# Patient Record
Sex: Female | Born: 1935
Health system: Southern US, Community
[De-identification: ages and names within clinical notes are randomized; demographics above are authoritative.]

## PROBLEM LIST (undated history)

## (undated) DIAGNOSIS — E7800 Pure hypercholesterolemia, unspecified: Secondary | ICD-10-CM

## (undated) DIAGNOSIS — G8929 Other chronic pain: Secondary | ICD-10-CM

## (undated) DIAGNOSIS — Z87442 Personal history of urinary calculi: Secondary | ICD-10-CM

## (undated) DIAGNOSIS — Z9861 Coronary angioplasty status: Secondary | ICD-10-CM

## (undated) DIAGNOSIS — R519 Headache, unspecified: Secondary | ICD-10-CM

## (undated) DIAGNOSIS — E039 Hypothyroidism, unspecified: Secondary | ICD-10-CM

## (undated) DIAGNOSIS — I447 Left bundle-branch block, unspecified: Secondary | ICD-10-CM

## (undated) DIAGNOSIS — I255 Ischemic cardiomyopathy: Secondary | ICD-10-CM

## (undated) DIAGNOSIS — N3281 Overactive bladder: Secondary | ICD-10-CM

## (undated) DIAGNOSIS — K219 Gastro-esophageal reflux disease without esophagitis: Secondary | ICD-10-CM

## (undated) DIAGNOSIS — M5416 Radiculopathy, lumbar region: Secondary | ICD-10-CM

## (undated) DIAGNOSIS — Z8489 Family history of other specified conditions: Secondary | ICD-10-CM

## (undated) DIAGNOSIS — M5135 Other intervertebral disc degeneration, thoracolumbar region: Secondary | ICD-10-CM

## (undated) DIAGNOSIS — F341 Dysthymic disorder: Secondary | ICD-10-CM

## (undated) DIAGNOSIS — R7301 Impaired fasting glucose: Secondary | ICD-10-CM

## (undated) DIAGNOSIS — I5022 Chronic systolic (congestive) heart failure: Secondary | ICD-10-CM

## (undated) DIAGNOSIS — T4145XA Adverse effect of unspecified anesthetic, initial encounter: Secondary | ICD-10-CM

## (undated) DIAGNOSIS — T8859XA Other complications of anesthesia, initial encounter: Secondary | ICD-10-CM

## (undated) DIAGNOSIS — I2510 Atherosclerotic heart disease of native coronary artery without angina pectoris: Secondary | ICD-10-CM

## (undated) DIAGNOSIS — R51 Headache: Secondary | ICD-10-CM

## (undated) DIAGNOSIS — I10 Essential (primary) hypertension: Secondary | ICD-10-CM

## (undated) HISTORY — PX: TONSILLECTOMY: SUR1361

## (undated) HISTORY — DX: Impaired fasting glucose: R73.01

## (undated) HISTORY — DX: Other intervertebral disc degeneration, thoracolumbar region: M51.35

## (undated) HISTORY — DX: Gastro-esophageal reflux disease without esophagitis: K21.9

## (undated) HISTORY — DX: Hypothyroidism, unspecified: E03.9

## (undated) HISTORY — PX: CORNEAL TRANSPLANT: SHX108

## (undated) HISTORY — DX: Left bundle-branch block, unspecified: I44.7

## (undated) HISTORY — DX: Overactive bladder: N32.81

## (undated) HISTORY — PX: CATARACT EXTRACTION W/ INTRAOCULAR LENS  IMPLANT, BILATERAL: SHX1307

## (undated) HISTORY — DX: Essential (primary) hypertension: I10

## (undated) HISTORY — DX: Dysthymic disorder: F34.1

## (undated) HISTORY — DX: Coronary angioplasty status: Z98.61

## (undated) HISTORY — DX: Atherosclerotic heart disease of native coronary artery without angina pectoris: I25.10

## (undated) HISTORY — PX: EYE SURGERY: SHX253

---

## 1978-09-02 HISTORY — PX: KIDNEY STONE SURGERY: SHX686

## 2014-10-04 DIAGNOSIS — T86848 Other complications of corneal transplant: Secondary | ICD-10-CM | POA: Diagnosis not present

## 2014-10-04 DIAGNOSIS — T85398A Other mechanical complication of other ocular prosthetic devices, implants and grafts, initial encounter: Secondary | ICD-10-CM | POA: Diagnosis not present

## 2014-11-02 DIAGNOSIS — J9801 Acute bronchospasm: Secondary | ICD-10-CM | POA: Diagnosis not present

## 2014-11-02 DIAGNOSIS — J209 Acute bronchitis, unspecified: Secondary | ICD-10-CM | POA: Diagnosis not present

## 2014-12-13 DIAGNOSIS — R7301 Impaired fasting glucose: Secondary | ICD-10-CM | POA: Diagnosis not present

## 2014-12-13 DIAGNOSIS — E782 Mixed hyperlipidemia: Secondary | ICD-10-CM | POA: Diagnosis not present

## 2014-12-13 DIAGNOSIS — E039 Hypothyroidism, unspecified: Secondary | ICD-10-CM | POA: Diagnosis not present

## 2014-12-20 DIAGNOSIS — E039 Hypothyroidism, unspecified: Secondary | ICD-10-CM | POA: Diagnosis not present

## 2014-12-20 DIAGNOSIS — N3941 Urge incontinence: Secondary | ICD-10-CM | POA: Diagnosis not present

## 2014-12-20 DIAGNOSIS — E782 Mixed hyperlipidemia: Secondary | ICD-10-CM | POA: Diagnosis not present

## 2014-12-20 DIAGNOSIS — D3910 Neoplasm of uncertain behavior of unspecified ovary: Secondary | ICD-10-CM | POA: Diagnosis not present

## 2014-12-20 DIAGNOSIS — K219 Gastro-esophageal reflux disease without esophagitis: Secondary | ICD-10-CM | POA: Diagnosis not present

## 2015-01-16 DIAGNOSIS — R110 Nausea: Secondary | ICD-10-CM | POA: Diagnosis not present

## 2015-01-16 DIAGNOSIS — A09 Infectious gastroenteritis and colitis, unspecified: Secondary | ICD-10-CM | POA: Diagnosis not present

## 2015-04-04 DIAGNOSIS — T85398A Other mechanical complication of other ocular prosthetic devices, implants and grafts, initial encounter: Secondary | ICD-10-CM | POA: Diagnosis not present

## 2015-04-04 DIAGNOSIS — T86848 Other complications of corneal transplant: Secondary | ICD-10-CM | POA: Diagnosis not present

## 2015-07-12 DIAGNOSIS — R7301 Impaired fasting glucose: Secondary | ICD-10-CM | POA: Diagnosis not present

## 2015-07-12 DIAGNOSIS — K219 Gastro-esophageal reflux disease without esophagitis: Secondary | ICD-10-CM | POA: Diagnosis not present

## 2015-07-12 DIAGNOSIS — J309 Allergic rhinitis, unspecified: Secondary | ICD-10-CM | POA: Diagnosis not present

## 2015-07-12 DIAGNOSIS — N3281 Overactive bladder: Secondary | ICD-10-CM | POA: Diagnosis not present

## 2015-07-12 DIAGNOSIS — I10 Essential (primary) hypertension: Secondary | ICD-10-CM | POA: Diagnosis not present

## 2015-07-12 DIAGNOSIS — Z23 Encounter for immunization: Secondary | ICD-10-CM | POA: Diagnosis not present

## 2015-07-12 DIAGNOSIS — F39 Unspecified mood [affective] disorder: Secondary | ICD-10-CM | POA: Diagnosis not present

## 2015-07-12 DIAGNOSIS — E785 Hyperlipidemia, unspecified: Secondary | ICD-10-CM | POA: Diagnosis not present

## 2015-07-12 DIAGNOSIS — Z6828 Body mass index (BMI) 28.0-28.9, adult: Secondary | ICD-10-CM | POA: Diagnosis not present

## 2015-10-05 DIAGNOSIS — H52213 Irregular astigmatism, bilateral: Secondary | ICD-10-CM | POA: Diagnosis not present

## 2015-10-05 DIAGNOSIS — T85398A Other mechanical complication of other ocular prosthetic devices, implants and grafts, initial encounter: Secondary | ICD-10-CM | POA: Diagnosis not present

## 2015-11-16 DIAGNOSIS — M1990 Unspecified osteoarthritis, unspecified site: Secondary | ICD-10-CM | POA: Diagnosis not present

## 2015-11-16 DIAGNOSIS — I10 Essential (primary) hypertension: Secondary | ICD-10-CM | POA: Diagnosis not present

## 2015-11-16 DIAGNOSIS — F39 Unspecified mood [affective] disorder: Secondary | ICD-10-CM | POA: Diagnosis not present

## 2015-11-16 DIAGNOSIS — E038 Other specified hypothyroidism: Secondary | ICD-10-CM | POA: Diagnosis not present

## 2015-11-16 DIAGNOSIS — R7301 Impaired fasting glucose: Secondary | ICD-10-CM | POA: Diagnosis not present

## 2015-11-16 DIAGNOSIS — Z6829 Body mass index (BMI) 29.0-29.9, adult: Secondary | ICD-10-CM | POA: Diagnosis not present

## 2015-11-16 DIAGNOSIS — N3281 Overactive bladder: Secondary | ICD-10-CM | POA: Diagnosis not present

## 2015-11-16 DIAGNOSIS — H9393 Unspecified disorder of ear, bilateral: Secondary | ICD-10-CM | POA: Diagnosis not present

## 2015-11-16 DIAGNOSIS — K219 Gastro-esophageal reflux disease without esophagitis: Secondary | ICD-10-CM | POA: Diagnosis not present

## 2015-11-16 DIAGNOSIS — E784 Other hyperlipidemia: Secondary | ICD-10-CM | POA: Diagnosis not present

## 2016-04-15 DIAGNOSIS — H52213 Irregular astigmatism, bilateral: Secondary | ICD-10-CM | POA: Diagnosis not present

## 2016-04-15 DIAGNOSIS — T85398D Other mechanical complication of other ocular prosthetic devices, implants and grafts, subsequent encounter: Secondary | ICD-10-CM | POA: Diagnosis not present

## 2016-04-29 DIAGNOSIS — I10 Essential (primary) hypertension: Secondary | ICD-10-CM | POA: Diagnosis not present

## 2016-04-29 DIAGNOSIS — R8299 Other abnormal findings in urine: Secondary | ICD-10-CM | POA: Diagnosis not present

## 2016-04-29 DIAGNOSIS — R358 Other polyuria: Secondary | ICD-10-CM | POA: Diagnosis not present

## 2016-04-29 DIAGNOSIS — R7301 Impaired fasting glucose: Secondary | ICD-10-CM | POA: Diagnosis not present

## 2016-04-29 DIAGNOSIS — E785 Hyperlipidemia, unspecified: Secondary | ICD-10-CM | POA: Diagnosis not present

## 2016-04-29 DIAGNOSIS — E038 Other specified hypothyroidism: Secondary | ICD-10-CM | POA: Diagnosis not present

## 2016-05-13 DIAGNOSIS — K219 Gastro-esophageal reflux disease without esophagitis: Secondary | ICD-10-CM | POA: Diagnosis not present

## 2016-05-13 DIAGNOSIS — E038 Other specified hypothyroidism: Secondary | ICD-10-CM | POA: Diagnosis not present

## 2016-05-13 DIAGNOSIS — N3281 Overactive bladder: Secondary | ICD-10-CM | POA: Diagnosis not present

## 2016-05-13 DIAGNOSIS — Z1389 Encounter for screening for other disorder: Secondary | ICD-10-CM | POA: Diagnosis not present

## 2016-05-13 DIAGNOSIS — I10 Essential (primary) hypertension: Secondary | ICD-10-CM | POA: Diagnosis not present

## 2016-05-13 DIAGNOSIS — J309 Allergic rhinitis, unspecified: Secondary | ICD-10-CM | POA: Diagnosis not present

## 2016-05-13 DIAGNOSIS — Z0000 Encounter for general adult medical examination without abnormal findings: Secondary | ICD-10-CM | POA: Diagnosis not present

## 2016-05-13 DIAGNOSIS — F39 Unspecified mood [affective] disorder: Secondary | ICD-10-CM | POA: Diagnosis not present

## 2016-05-13 DIAGNOSIS — R7301 Impaired fasting glucose: Secondary | ICD-10-CM | POA: Diagnosis not present

## 2016-05-13 DIAGNOSIS — E784 Other hyperlipidemia: Secondary | ICD-10-CM | POA: Diagnosis not present

## 2016-05-13 DIAGNOSIS — I447 Left bundle-branch block, unspecified: Secondary | ICD-10-CM | POA: Diagnosis not present

## 2016-05-13 DIAGNOSIS — Z23 Encounter for immunization: Secondary | ICD-10-CM | POA: Diagnosis not present

## 2016-05-14 ENCOUNTER — Other Ambulatory Visit: Payer: Self-pay | Admitting: Internal Medicine

## 2016-05-14 DIAGNOSIS — I447 Left bundle-branch block, unspecified: Secondary | ICD-10-CM

## 2016-05-17 ENCOUNTER — Other Ambulatory Visit: Payer: Self-pay

## 2016-05-17 ENCOUNTER — Ambulatory Visit (HOSPITAL_COMMUNITY): Payer: Medicare Other | Attending: Cardiology

## 2016-05-17 DIAGNOSIS — I351 Nonrheumatic aortic (valve) insufficiency: Secondary | ICD-10-CM | POA: Insufficient documentation

## 2016-05-17 DIAGNOSIS — E785 Hyperlipidemia, unspecified: Secondary | ICD-10-CM | POA: Insufficient documentation

## 2016-05-17 DIAGNOSIS — I071 Rheumatic tricuspid insufficiency: Secondary | ICD-10-CM | POA: Diagnosis not present

## 2016-05-17 DIAGNOSIS — I447 Left bundle-branch block, unspecified: Secondary | ICD-10-CM | POA: Diagnosis not present

## 2016-05-17 DIAGNOSIS — I10 Essential (primary) hypertension: Secondary | ICD-10-CM | POA: Diagnosis not present

## 2016-05-17 DIAGNOSIS — I340 Nonrheumatic mitral (valve) insufficiency: Secondary | ICD-10-CM | POA: Diagnosis not present

## 2016-05-17 HISTORY — PX: TRANSTHORACIC ECHOCARDIOGRAM: SHX275

## 2016-05-22 ENCOUNTER — Other Ambulatory Visit (HOSPITAL_COMMUNITY): Payer: Self-pay

## 2016-05-28 ENCOUNTER — Telehealth: Payer: Self-pay | Admitting: Cardiology

## 2016-05-28 NOTE — Telephone Encounter (Signed)
Records received from [place] for apt on 06/24/16 with Dr Ellyn Hack, records given to [place] (medical records) CN

## 2016-06-24 ENCOUNTER — Ambulatory Visit (INDEPENDENT_AMBULATORY_CARE_PROVIDER_SITE_OTHER): Payer: Medicare Other | Admitting: Cardiology

## 2016-06-24 ENCOUNTER — Encounter: Payer: Self-pay | Admitting: Cardiology

## 2016-06-24 VITALS — BP 132/80 | HR 87 | Ht 59.5 in | Wt 144.0 lb

## 2016-06-24 DIAGNOSIS — I447 Left bundle-branch block, unspecified: Secondary | ICD-10-CM | POA: Diagnosis not present

## 2016-06-24 DIAGNOSIS — I255 Ischemic cardiomyopathy: Secondary | ICD-10-CM

## 2016-06-24 DIAGNOSIS — I209 Angina pectoris, unspecified: Secondary | ICD-10-CM | POA: Insufficient documentation

## 2016-06-24 DIAGNOSIS — R0609 Other forms of dyspnea: Secondary | ICD-10-CM | POA: Diagnosis not present

## 2016-06-24 DIAGNOSIS — R0600 Dyspnea, unspecified: Secondary | ICD-10-CM

## 2016-06-24 NOTE — Patient Instructions (Signed)
PLEASE HAVE THESE LABS DONE: BMP, PTT, Pt AND CBC  Your physician has requested that you have a cardiac catheterization. Cardiac catheterization is used to diagnose and/or treat various heart conditions. Doctors may recommend this procedure for a number of different reasons. The most common reason is to evaluate chest pain. Chest pain can be a symptom of coronary artery disease (CAD), and cardiac catheterization can show whether plaque is narrowing or blocking your heart's arteries. This procedure is also used to evaluate the valves, as well as measure the blood flow and oxygen levels in different parts of your heart. For further information please visit [facility]ugeFiesta.tn. Please follow instruction sheet, as given.  SCHEDULE CATH WITH DR [facility]   Your physician recommends that you schedule a follow-up appointment in: [place]

## 2016-06-24 NOTE — Progress Notes (Signed)
PCP: Tivis Ringer, MD  Clinic Note: Chief Complaint  Patient presents with  . New Patient (Initial Visit)    New diagnosis of LBBB, cardiac myopathy noted on echo  . Shortness of Breath    when walking     HPI: Leah Trujillo is a 82 y.o. female with a PMH below who presents today for initial cardiology consultation.  Extra heart Beats -- LBBB.  Echo done.    Hollie Salk Rosendahl was Seen by her new PCP (Dr. Dagmar Hait) to establish care on 05/28/2016. During that visit, she noted (with the persistence of her daughter) that she has been noticing progressively worsening exertional dyspnea. He does note having significant mobility disability secondary to arthritis, but notes that in addition to her back and hips hurting her, she really is limited by shortness of breath simply walking in from the parking deck. She was noted on EKG to have a left bundle branch block and frequent PACs. This was evaluated with a chest x-ray that did not show any pulmonary edema, but an echocardiogram showed severely reduced EF as noted below. She now presents for cardiology evaluation.  Recent Hospitalizations: None  Studies Reviewed -- PSH updated  06-13-2016 - Echo: Normal LV size. Moderate-severely reduced EF of 35-40% with diffuse HK. Only GR 1 DD noted.  Abdomen normal, dyssynchronous septal motion secondary to LBBB. Moderate TR with PA pressures estimated at 44 mmHg. (Moderate pulmonary hypertension.  Interval History: Wrenly herself is not the most forthcoming historian, most of the history is given by her daughter. She recently moved from [facility] very (roughly a year ago). He had been followed by a ECP they're routinely, but had never had a cardiology evaluation. She notes persistent exertional dyspnea that is limiting in addition to her back pain. She denies any PND orthopnea or edema however. She also denies any rapid or irregular heartbeat/palpitations. Nothing to suggest  any arrhythmias. She has never had any symptoms of chest tightness or pressure with rest or exertion.  She occasionally may notice some positional dizziness, but no syncope or near syncope, TIA/amaurosis fugax.  No melena, hematochezia, hematuria, or epstaxis. No claudication.   ROS: A comprehensive was performed. Review of Systems  Constitutional: Negative for malaise/fatigue (Just somewhat deconditioned due to lack of activity.).  HENT: Positive for hearing loss (Cannot hear very well out of left ear). Negative for congestion and nosebleeds.   Eyes:       Can only see light out of left eye. Has had 3 retinal transplants on right eye.  Respiratory: Positive for shortness of breath (With exertion) and wheezing (With lying down.). Negative for cough.   Gastrointestinal: Positive for heartburn (Takes Prilosec 2 times a day that controls relatively well.). Negative for blood in stool, constipation and melena.  Genitourinary: Negative for dysuria, frequency (She does have nocturia that wakes her up in the middle of night.) and hematuria.  Musculoskeletal: Positive for back pain, joint pain (Bilateral knee) and neck pain. Negative for myalgias.  Neurological: Positive for dizziness (Some mild positional). Negative for focal weakness, loss of consciousness and headaches.  Endo/Heme/Allergies: Negative for environmental allergies. Does not bruise/bleed easily.  Psychiatric/Behavioral: Negative for memory loss. The patient is not nervous/anxious and does not have insomnia.     Past Medical History:  Diagnosis Date  . DJD (degenerative joint disease), thoracolumbar    Chronic back pain but also neck and knees  . Dysthymia    Well-controlled on Celexa  .  GERD (gastroesophageal reflux disease)   . Hypertension   . Hypothyroidism (acquired)    On thyroid replacement  . Impaired fasting glucose    He will A1c 5.7 from August 2017  . LBBB (left bundle branch block)   . OAB (overactive bladder)      Past Surgical History:  Procedure Laterality Date  . TRANSTHORACIC ECHOCARDIOGRAM  05/17/2016   Normal LV size. Moderate-severely reduced EF of 35-40% with diffuse HK. Only GR 1 DD noted.  Abdomen normal, dyssynchronous septal motion secondary to LBBB. Moderate TR with PA pressures estimated at 44 mmHg. (Moderate pulmonary hypertension.     Prior to Admission medications   Medication Sig Start Date End Date Taking? Authorizing Provider  aspirin 81 MG tablet Take 81 mg by mouth daily.   Yes Historical Provider, MD  citalopram (CELEXA) 10 MG tablet Take 10 mg by mouth daily. 03/13/16  Yes Historical Provider, MD  desonide (DESOWEN) 0.05 % cream Apply 1 application topically 2 (two) times daily. 05/13/16  Yes Historical Provider, MD  levothyroxine (SYNTHROID, LEVOTHROID) 50 MCG tablet Take 50 mcg by mouth daily. 04/01/16  Yes Historical Provider, MD  loratadine (CLARITIN) 10 MG tablet Take 10 mg by mouth daily.   Yes Historical Provider, MD  losartan (COZAAR) 50 MG tablet Take 50 mg by mouth daily. 05/07/16  Yes Historical Provider, MD  montelukast (SINGULAIR) 10 MG tablet Take 10 mg by mouth daily. 05/07/16  Yes Historical Provider, MD  omeprazole (PRILOSEC) 20 MG capsule Take 20 mg by mouth 2 (two) times daily. 03/14/16  Yes Historical Provider, MD  pravastatin (PRAVACHOL) 80 MG tablet Take 80 mg by mouth at bedtime. 03/20/16  Yes Historical Provider, MD  prednisoLONE acetate (PRED FORTE) 1 % ophthalmic suspension Place 1 drop into both eyes daily. 03/15/16  Yes Historical Provider, MD    Allergies  Allergen Reactions  . Codeine Hives    Social History   Social History  . Marital status: Widowed    Spouse name: N/A  . Number of children: 1  . Years of education: 40   Occupational History  .      Former Programmer, applications   Social History Main Topics  . Smoking status: Never Smoker  . Smokeless tobacco: Never Used  . Alcohol use 0.6 oz/week    1 Standard drinks or equivalent per week    . Drug use: No  . Sexual activity: No   Other Topics Concern  . None   Social History Narrative   Widowed mother of one, 2 grandchildren and one great-grandchild.   Recently moved [place] in 2016 to live close to her daughter. Currently lives alone and is relatively independent of ADLs. She does minimal work around the house.   He previously worked Water quality scientist a Hospital doctor. He did one year of business school after high school.   Does minimal walking maybe 15 minutes a week mostly because of back pain.   She does not smoke. Never smoked. Drinks one at all drink a week.    Family History  Problem Relation Age of Onset  . Hypertension Mother   . Hyperlipidemia Mother   . Heart disease Mother     Pacemaker  . Heart disease Father     Valve disorder  . Kidney Stones Father   . AAA (abdominal aortic aneurysm) Father   . Stroke Maternal Grandmother   . Heart attack Maternal Grandfather   . Heart disease Paternal Grandmother     Wt Readings  from Last 3 Encounters:  06/24/16 65.3 kg (144 lb)    PHYSICAL EXAM BP 132/80 (BP Location: Left Arm, Patient Position: Sitting)   Pulse 87   Ht 4' 11.5" (1.511 m)   Wt 65.3 kg (144 lb)   BMI 28.60 kg/m  General appearance: alert, cooperative, appears stated age, no distress and borderline obese.  Well-nourished and well-groomed. HEENT: [place]/AT, EOMI, MMM, anicteric sclera - pupils are round and minimally reactive to light. Right is more reactive than left. Neck: no adenopathy, no carotid bruit and no JVD Lungs: clear to auscultation bilaterally with the exception of faint bibasilar crackles. Normal percussion bilaterally and non-labored Heart: regular rate and rhythm, S1 normal, and split S2 normal, soft 1/6 HSM at LUSB. no click, rub or gallop; nondisplaced PMI Abdomen: soft, non-tender; bowel sounds normal; no masses,  no organomegaly; no HJR Extremities: extremities normal, atraumatic, no cyanosis, and trivial edema  Pulses: 2+ and  symmetric;  Skin: normal, mobility and turgor normal, no edema, no evidence of bleeding or bruising and no lesions noted Neurologic: Mental status: Alert, oriented, thought content appropriate; normal mood and affect. Poor historian. Partly because of poor hearing Cranial nerves: normal (II-XII grossly intact)    Adult ECG Report  Rate: 87 ;  Rhythm: normal sinus rhythm, premature atrial contractions (PAC) and LBBB. Otherwise normal axis, intervals and durations.  Narrative Interpretation: Essentially stable EKG from PCP. Cannot exclude inferior infarct, with Q waves in lead 3 only. Not noted in 2 or aVF. Axis is -22.   Other studies Reviewed: Additional studies/ records that were reviewed today include:  Recent Labs:  No labs available currently    ASSESSMENT / PLAN: Problem List Items Addressed This Visit    Left bundle branch block (Chronic)    Not is at the shoulder with the duration of left bundle-branch is. It is possible that the bundle branch itself is the reason for her cardiomyopathy, however we need to exclude an ischemic   Plan: Left heart catheterization based on echo cardiac exam finding.      Relevant Orders   EKG 12-Lead   LEFT HEART CATHETERIZATION WITH CORONARY ANGIOGRAM   APTT   Protime-INR   Basic metabolic panel   Dyspnea    Exertional dyspnea quite likely related to cardiomyopathy. I would like to add carvedilol 3.5 mg twice a day, but will wait until he performed a catheterization to determine her volume status to start this, and also likely start a standing Lasix dose.      Relevant Orders   EKG 12-Lead   LEFT HEART CATHETERIZATION WITH CORONARY ANGIOGRAM   APTT   Protime-INR   Basic metabolic panel   Cardiomyopathy, cannot exclude ischemic - Primary    New diagnosis of cardiomyopathy with an EF of 35-40% and global hypokinesis. With a left bundle branch block on EKG, we need to exclude an ischemic etiology. She has progressively worsening dyspnea over  the last few months that is limiting in addition to her arthritic disease. With a left bundle branch block, Myoview would not be a very accurate test with high likelihood of false positive findings.  I'm concerned that her dyspnea could very well be an anginal equivalent. With this in mind and the high likelihood of false positive finding on stress test, I think the best course of action is to proceed with cardiac catheterization for diagnostic purposes. Caryl Comes I had a long talk with the patient and her daughter about what diagnostic procedures we could proceed  with including simple medical management, nuclear stress test versus cardiac catheterization. It is my opinion that catheterization is probably the best option. She agreed that she would prefer to skip the stress test and go directly to cardiac catheterization. She did indicate however that she would not be distended and CABG it becomes to that.  Plan:   Continue current dose of losartan. Would likely start carvedilol 3.125 mg twice a day plus or minus standing dose of Lasix based on catheterization result.  Schedule left heart catheterization, likely next week. She will need preop labs. We'll plan right radial access.  Performing MD:  Glenetta Hew, M.D., M.S.  Procedure:  Left Heart Catheterization with Coronary Angiography and Possible Percutaneous Coronary Intervention   The procedure with Risks/Benefits/Alternatives and Indications was reviewed with the patient and her daughter.  All questions were answered.    Risks / Complications include, but not limited to: Death, MI, CVA/TIA, VF/VT (with defibrillation), Bradycardia (need for temporary pacer placement), contrast induced nephropathy, bleeding / bruising / hematoma / pseudoaneurysm, vascular or coronary injury (with possible emergent CT or Vascular Surgery), adverse medication reactions, infection.  Additional risks involving the use of radiation with the possibility of radiation burns  and cancer were explained in detail.  The patient and daughter voice understanding and agree to proceed.         Angina, class III ([place]) - with dyspnea as the main symptom    She really doesn't have chest pain, her symptoms more exertional dyspnea with reduced EF. I'm concerned that this could be a potential anginal equivalent.      Relevant Orders   EKG 12-Lead   LEFT HEART CATHETERIZATION WITH CORONARY ANGIOGRAM   APTT   Protime-INR   Basic metabolic panel    Other Visit Diagnoses   None.     Current medicines are reviewed at length with the patient today. (+/- concerns)  The following changes have been made: No changes at present  Patient Instructions  PLEASE HAVE THESE LABS DONE: BMP, PTT, Pt AND CBC  Your physician has requested that you have a cardiac catheterization. Cardiac catheterization is used to diagnose and/or treat various heart conditions. Doctors may recommend this procedure for a number of different reasons. The most common reason is to evaluate chest pain. Chest pain can be a symptom of coronary artery disease (CAD), and cardiac catheterization can show whether plaque is narrowing or blocking your heart's arteries. This procedure is also used to evaluate the valves, as well as measure the blood flow and oxygen levels in different parts of your heart. For further information please visit [facility]uge[facility]iesta[facility]tn. Please follow instruction sheet, as given.  SCHEDULE CATH WITH DR [facility]   Your physician recommends that you schedule a follow-up appointment in: [place]    Studies Ordered:   Orders Placed This Encounter  Procedures  . APTT  . Protime-INR  . Basic metabolic panel  . EKG 12-Lead  . LEFT HEART CATHETERIZATION WITH CORONARY Illene Silver, M.D., M.S. Interventional Cardiologist   Pager # [phone] Phone # [phone] [address] [place], [place] [address]

## 2016-06-25 ENCOUNTER — Telehealth: Payer: Self-pay | Admitting: *Deleted

## 2016-06-25 ENCOUNTER — Encounter: Payer: Self-pay | Admitting: Cardiology

## 2016-06-25 DIAGNOSIS — I255 Ischemic cardiomyopathy: Secondary | ICD-10-CM | POA: Insufficient documentation

## 2016-06-25 NOTE — Assessment & Plan Note (Addendum)
New diagnosis of cardiomyopathy with an EF of 35-40% and global hypokinesis. With a left bundle branch block on EKG, we need to exclude an ischemic etiology. She has progressively worsening dyspnea over the last few months that is limiting in addition to her arthritic disease. With a left bundle branch block, Myoview would not be a very accurate test with high likelihood of false positive findings.  I'm concerned that her dyspnea could very well be an anginal equivalent. With this in mind and the high likelihood of false positive finding on stress test, I think the best course of action is to proceed with cardiac catheterization for diagnostic purposes. Caryl Comes I had a long talk with the patient and her daughter about what diagnostic procedures we could proceed with including simple medical management, nuclear stress test versus cardiac catheterization. It is my opinion that catheterization is probably the best option. She agreed that she would prefer to skip the stress test and go directly to cardiac catheterization. She did indicate however that she would not be distended and CABG it becomes to that.  Plan:   Continue current dose of losartan. Would likely start carvedilol 3.125 mg twice a day plus or minus standing dose of Lasix based on catheterization result.  Schedule left heart catheterization, likely next week. She will need preop labs. We'll plan right radial access.  Performing MD:  Glenetta Hew, M.D., M.S.  Procedure:  Left Heart Catheterization with Coronary Angiography and Possible Percutaneous Coronary Intervention   The procedure with Risks/Benefits/Alternatives and Indications was reviewed with the patient and her daughter.  All questions were answered.    Risks / Complications include, but not limited to: Death, MI, CVA/TIA, VF/VT (with defibrillation), Bradycardia (need for temporary pacer placement), contrast induced nephropathy, bleeding / bruising / hematoma / pseudoaneurysm,  vascular or coronary injury (with possible emergent CT or Vascular Surgery), adverse medication reactions, infection.  Additional risks involving the use of radiation with the possibility of radiation burns and cancer were explained in detail.  The patient and daughter voice understanding and agree to proceed.

## 2016-06-25 NOTE — Assessment & Plan Note (Signed)
Exertional dyspnea quite likely related to cardiomyopathy. I would like to add carvedilol 3.5 mg twice a day, but will wait until he performed a catheterization to determine her volume status to start this, and also likely start a standing Lasix dose.

## 2016-06-25 NOTE — Assessment & Plan Note (Signed)
She really doesn't have chest pain, her symptoms more exertional dyspnea with reduced EF. I'm concerned that this could be a potential anginal equivalent.

## 2016-06-25 NOTE — Assessment & Plan Note (Signed)
Not is at the shoulder with the duration of left bundle-branch is. It is possible that the bundle branch itself is the reason for her cardiomyopathy, however we need to exclude an ischemic   Plan: Left heart catheterization based on echo cardiac exam finding.

## 2016-06-25 NOTE — Telephone Encounter (Signed)
Spoke with daughter regarding cardiac catheterization that was ordered by Dr. Ellyn Hack.  Scheduled for Tuesday 07/09/16 @ 9:00 am---arrive at 7:00 am at [facility] center at [facility]---NPO after midnight (may take morning meds with a sipof water).  Please have lab work drawn on 07/03/16.  Daughter was informed I would be mailing a copy of these instructions to her mother.  She voiced her understanding.

## 2016-06-27 ENCOUNTER — Other Ambulatory Visit: Payer: Self-pay | Admitting: *Deleted

## 2016-06-27 DIAGNOSIS — R0600 Dyspnea, unspecified: Secondary | ICD-10-CM

## 2016-06-27 DIAGNOSIS — Z01818 Encounter for other preprocedural examination: Secondary | ICD-10-CM

## 2016-07-04 DIAGNOSIS — R079 Chest pain, unspecified: Secondary | ICD-10-CM | POA: Diagnosis not present

## 2016-07-04 DIAGNOSIS — R0600 Dyspnea, unspecified: Secondary | ICD-10-CM | POA: Diagnosis not present

## 2016-07-04 DIAGNOSIS — I447 Left bundle-branch block, unspecified: Secondary | ICD-10-CM | POA: Diagnosis not present

## 2016-07-04 LAB — PROTIME-INR
INR: 1
Prothrombin Time: 10.7 s (ref 9.0–11.5)

## 2016-07-04 LAB — APTT: APTT: 26 s (ref 22–34)

## 2016-07-05 LAB — BASIC METABOLIC PANEL
BUN: 12 mg/dL (ref 7–25)
CHLORIDE: 104 mmol/L (ref 98–110)
CO2: 25 mmol/L (ref 20–31)
Calcium: 9.5 mg/dL (ref 8.6–10.4)
Creat: 1.07 mg/dL — ABNORMAL HIGH (ref 0.60–0.88)
GLUCOSE: 100 mg/dL — AB (ref 65–99)
POTASSIUM: 4.4 mmol/L (ref 3.5–5.3)
SODIUM: 141 mmol/L (ref 135–146)

## 2016-07-08 ENCOUNTER — Other Ambulatory Visit: Payer: Self-pay | Admitting: *Deleted

## 2016-07-09 ENCOUNTER — Encounter (HOSPITAL_COMMUNITY)
Admission: RE | Disposition: A | Discharge status: Home / Self Care | Payer: Self-pay | Source: Ambulatory Visit | Attending: Cardiology

## 2016-07-09 ENCOUNTER — Ambulatory Visit (HOSPITAL_COMMUNITY)
Admission: RE | Admit: 2016-07-09 | Discharge: 2016-07-10 | Disposition: A | Discharge status: Home / Self Care | Payer: Medicare Other | Source: Ambulatory Visit | Attending: Cardiology | Admitting: Cardiology

## 2016-07-09 ENCOUNTER — Encounter (HOSPITAL_COMMUNITY): Payer: Self-pay | Admitting: General Practice

## 2016-07-09 DIAGNOSIS — I209 Angina pectoris, unspecified: Secondary | ICD-10-CM | POA: Diagnosis present

## 2016-07-09 DIAGNOSIS — G8929 Other chronic pain: Secondary | ICD-10-CM | POA: Diagnosis not present

## 2016-07-09 DIAGNOSIS — I2510 Atherosclerotic heart disease of native coronary artery without angina pectoris: Secondary | ICD-10-CM | POA: Diagnosis not present

## 2016-07-09 DIAGNOSIS — N3281 Overactive bladder: Secondary | ICD-10-CM | POA: Diagnosis not present

## 2016-07-09 DIAGNOSIS — I5042 Chronic combined systolic (congestive) and diastolic (congestive) heart failure: Secondary | ICD-10-CM | POA: Insufficient documentation

## 2016-07-09 DIAGNOSIS — R7301 Impaired fasting glucose: Secondary | ICD-10-CM | POA: Diagnosis not present

## 2016-07-09 DIAGNOSIS — F341 Dysthymic disorder: Secondary | ICD-10-CM | POA: Diagnosis not present

## 2016-07-09 DIAGNOSIS — Z7982 Long term (current) use of aspirin: Secondary | ICD-10-CM | POA: Diagnosis not present

## 2016-07-09 DIAGNOSIS — M47895 Other spondylosis, thoracolumbar region: Secondary | ICD-10-CM | POA: Insufficient documentation

## 2016-07-09 DIAGNOSIS — I110 Hypertensive heart disease with heart failure: Secondary | ICD-10-CM | POA: Diagnosis not present

## 2016-07-09 DIAGNOSIS — Z79890 Hormone replacement therapy (postmenopausal): Secondary | ICD-10-CM | POA: Diagnosis not present

## 2016-07-09 DIAGNOSIS — I272 Pulmonary hypertension, unspecified: Secondary | ICD-10-CM | POA: Insufficient documentation

## 2016-07-09 DIAGNOSIS — I447 Left bundle-branch block, unspecified: Secondary | ICD-10-CM | POA: Diagnosis present

## 2016-07-09 DIAGNOSIS — K219 Gastro-esophageal reflux disease without esophagitis: Secondary | ICD-10-CM | POA: Diagnosis not present

## 2016-07-09 DIAGNOSIS — Z955 Presence of coronary angioplasty implant and graft: Secondary | ICD-10-CM

## 2016-07-09 DIAGNOSIS — I200 Unstable angina: Secondary | ICD-10-CM | POA: Diagnosis present

## 2016-07-09 DIAGNOSIS — E039 Hypothyroidism, unspecified: Secondary | ICD-10-CM | POA: Insufficient documentation

## 2016-07-09 DIAGNOSIS — Z01818 Encounter for other preprocedural examination: Secondary | ICD-10-CM

## 2016-07-09 DIAGNOSIS — I5023 Acute on chronic systolic (congestive) heart failure: Secondary | ICD-10-CM | POA: Insufficient documentation

## 2016-07-09 DIAGNOSIS — Z79899 Other long term (current) drug therapy: Secondary | ICD-10-CM | POA: Insufficient documentation

## 2016-07-09 DIAGNOSIS — I25110 Atherosclerotic heart disease of native coronary artery with unstable angina pectoris: Secondary | ICD-10-CM | POA: Insufficient documentation

## 2016-07-09 DIAGNOSIS — I428 Other cardiomyopathies: Secondary | ICD-10-CM | POA: Insufficient documentation

## 2016-07-09 DIAGNOSIS — R0600 Dyspnea, unspecified: Secondary | ICD-10-CM

## 2016-07-09 DIAGNOSIS — Z9861 Coronary angioplasty status: Secondary | ICD-10-CM

## 2016-07-09 HISTORY — DX: Ischemic cardiomyopathy: I25.5

## 2016-07-09 HISTORY — PX: CORONARY ANGIOPLASTY WITH STENT PLACEMENT: SHX49

## 2016-07-09 HISTORY — DX: Adverse effect of unspecified anesthetic, initial encounter: T41.45XA

## 2016-07-09 HISTORY — DX: Other chronic pain: G89.29

## 2016-07-09 HISTORY — DX: Headache, unspecified: R51.9

## 2016-07-09 HISTORY — DX: Atherosclerotic heart disease of native coronary artery without angina pectoris: I25.10

## 2016-07-09 HISTORY — PX: CARDIAC CATHETERIZATION: SHX172

## 2016-07-09 HISTORY — DX: Headache: R51

## 2016-07-09 HISTORY — DX: Pure hypercholesterolemia, unspecified: E78.00

## 2016-07-09 HISTORY — DX: Other complications of anesthesia, initial encounter: T88.59XA

## 2016-07-09 HISTORY — DX: Chronic systolic (congestive) heart failure: I50.22

## 2016-07-09 HISTORY — DX: Personal history of urinary calculi: Z87.442

## 2016-07-09 HISTORY — DX: Radiculopathy, lumbar region: M54.16

## 2016-07-09 HISTORY — DX: Family history of other specified conditions: Z84.89

## 2016-07-09 LAB — CBC
HCT: 41 % (ref 36.0–46.0)
HEMOGLOBIN: 13.7 g/dL (ref 12.0–15.0)
MCH: 31.7 pg (ref 26.0–34.0)
MCHC: 33.4 g/dL (ref 30.0–36.0)
MCV: 94.9 fL (ref 78.0–100.0)
PLATELETS: 149 10*3/uL — AB (ref 150–400)
RBC: 4.32 MIL/uL (ref 3.87–5.11)
RDW: 13.8 % (ref 11.5–15.5)
WBC: 5.7 10*3/uL (ref 4.0–10.5)

## 2016-07-09 LAB — POCT ACTIVATED CLOTTING TIME: ACTIVATED CLOTTING TIME: 290 s

## 2016-07-09 SURGERY — LEFT HEART CATH AND CORONARY ANGIOGRAPHY
Anesthesia: LOCAL

## 2016-07-09 MED ORDER — SODIUM CHLORIDE 0.9% FLUSH: 3.0000 mL | INTRAVENOUS | Status: DC | PRN

## 2016-07-09 MED ORDER — ANGIOPLASTY BOOK
Freq: Once | Status: AC
  Administered 2016-07-09: 18:00:00
  Filled 2016-07-09: qty 1

## 2016-07-09 MED ORDER — IOPAMIDOL (ISOVUE-370) INJECTION 76%
INTRAVENOUS | Status: AC
  Filled 2016-07-09: qty 50

## 2016-07-09 MED ORDER — ARTIFICIAL TEARS OP OINT
TOPICAL_OINTMENT | OPHTHALMIC | Status: DC | PRN
  Filled 2016-07-09: qty 3.5

## 2016-07-09 MED ORDER — FUROSEMIDE 10 MG/ML IJ SOLN
40.0000 mg | Freq: Once | INTRAMUSCULAR | Status: AC
  Administered 2016-07-09: 15:00:00 40 mg via INTRAVENOUS
  Filled 2016-07-09: qty 4

## 2016-07-09 MED ORDER — HYDROMORPHONE HCL 1 MG/ML IJ SOLN: 1.0000 mg | Freq: Once | INTRAMUSCULAR | Status: DC

## 2016-07-09 MED ORDER — IOPAMIDOL (ISOVUE-370) INJECTION 76%
INTRAVENOUS | Status: DC | PRN
  Administered 2016-07-09: 240 mL via INTRA_ARTERIAL

## 2016-07-09 MED ORDER — ONDANSETRON HCL 4 MG/2ML IJ SOLN: 4.0000 mg | Freq: Four times a day (QID) | INTRAMUSCULAR | Status: DC | PRN

## 2016-07-09 MED ORDER — IOPAMIDOL (ISOVUE-370) INJECTION 76%
INTRAVENOUS | Status: AC
Start: 2016-07-09 — End: 2016-07-09
  Filled 2016-07-09: qty 100

## 2016-07-09 MED ORDER — LIDOCAINE HCL (PF) 1 % IJ SOLN
INTRAMUSCULAR | Status: AC
  Filled 2016-07-09: qty 30

## 2016-07-09 MED ORDER — FUROSEMIDE 40 MG PO TABS
40.0000 mg | ORAL_TABLET | Freq: Every day | ORAL | Status: DC
  Administered 2016-07-10: 40 mg via ORAL
  Filled 2016-07-09: qty 1

## 2016-07-09 MED ORDER — ADENOSINE (DIAGNOSTIC) 140MCG/KG/MIN
INTRAVENOUS | Status: DC | PRN
  Administered 2016-07-09: 140 ug/kg/min via INTRAVENOUS

## 2016-07-09 MED ORDER — HEPARIN SODIUM (PORCINE) 1000 UNIT/ML IJ SOLN
INTRAMUSCULAR | Status: DC | PRN
  Administered 2016-07-09: 4000 [IU] via INTRAVENOUS
  Administered 2016-07-09: 3500 [IU] via INTRAVENOUS
  Administered 2016-07-09: 2000 [IU] via INTRAVENOUS

## 2016-07-09 MED ORDER — VERAPAMIL HCL 2.5 MG/ML IV SOLN
INTRAVENOUS | Status: DC | PRN
  Administered 2016-07-09: 10:00:00 via INTRA_ARTERIAL

## 2016-07-09 MED ORDER — HEPARIN (PORCINE) IN NACL 2-0.9 UNIT/ML-% IJ SOLN
INTRAMUSCULAR | Status: DC | PRN
Start: 2016-07-09 — End: 2016-07-09
  Administered 2016-07-09: 1000 mL via INTRA_ARTERIAL

## 2016-07-09 MED ORDER — IOPAMIDOL (ISOVUE-370) INJECTION 76%
INTRAVENOUS | Status: AC
  Filled 2016-07-09: qty 100

## 2016-07-09 MED ORDER — FENTANYL CITRATE (PF) 100 MCG/2ML IJ SOLN
INTRAMUSCULAR | Status: DC | PRN
  Administered 2016-07-09 (×2): 25 ug via INTRAVENOUS

## 2016-07-09 MED ORDER — LEVOTHYROXINE SODIUM 50 MCG PO TABS
50.0000 ug | ORAL_TABLET | Freq: Every day | ORAL | Status: DC
  Administered 2016-07-09: 50 ug via ORAL
  Filled 2016-07-09: qty 1

## 2016-07-09 MED ORDER — PRAVASTATIN SODIUM 40 MG PO TABS
80.0000 mg | ORAL_TABLET | Freq: Every day | ORAL | Status: DC
  Administered 2016-07-09: 21:00:00 80 mg via ORAL
  Filled 2016-07-09: qty 2

## 2016-07-09 MED ORDER — CLOPIDOGREL BISULFATE 300 MG PO TABS
ORAL_TABLET | ORAL | Status: AC
  Filled 2016-07-09: qty 1

## 2016-07-09 MED ORDER — MIDAZOLAM HCL 2 MG/2ML IJ SOLN
INTRAMUSCULAR | Status: AC
  Filled 2016-07-09: qty 2

## 2016-07-09 MED ORDER — HEPARIN (PORCINE) IN NACL 2-0.9 UNIT/ML-% IJ SOLN
INTRAMUSCULAR | Status: AC
  Filled 2016-07-09: qty 1000

## 2016-07-09 MED ORDER — CLOPIDOGREL BISULFATE 75 MG PO TABS
75.0000 mg | ORAL_TABLET | Freq: Every day | ORAL | Status: DC
  Administered 2016-07-10: 75 mg via ORAL
  Filled 2016-07-09: qty 1

## 2016-07-09 MED ORDER — ASPIRIN EC 81 MG PO TBEC
81.0000 mg | DELAYED_RELEASE_TABLET | Freq: Every day | ORAL | Status: DC
  Administered 2016-07-10: 10:00:00 81 mg via ORAL
  Filled 2016-07-09: qty 1

## 2016-07-09 MED ORDER — FENTANYL CITRATE (PF) 100 MCG/2ML IJ SOLN
INTRAMUSCULAR | Status: AC
  Filled 2016-07-09: qty 2

## 2016-07-09 MED ORDER — PANTOPRAZOLE SODIUM 40 MG PO TBEC
40.0000 mg | DELAYED_RELEASE_TABLET | Freq: Every day | ORAL | Status: DC
  Administered 2016-07-10: 40 mg via ORAL
  Filled 2016-07-09: qty 1

## 2016-07-09 MED ORDER — CLOPIDOGREL BISULFATE 300 MG PO TABS
ORAL_TABLET | ORAL | Status: DC | PRN
  Administered 2016-07-09: 300 mg via ORAL

## 2016-07-09 MED ORDER — VERAPAMIL HCL 2.5 MG/ML IV SOLN
INTRAVENOUS | Status: AC
Start: 2016-07-09 — End: 2016-07-09
  Filled 2016-07-09: qty 2

## 2016-07-09 MED ORDER — LOSARTAN POTASSIUM 50 MG PO TABS
50.0000 mg | ORAL_TABLET | Freq: Every day | ORAL | Status: DC
  Administered 2016-07-10: 10:00:00 50 mg via ORAL
  Filled 2016-07-09: qty 1

## 2016-07-09 MED ORDER — MIDAZOLAM HCL 2 MG/2ML IJ SOLN
INTRAMUSCULAR | Status: DC | PRN
  Administered 2016-07-09 (×2): 1 mg via INTRAVENOUS

## 2016-07-09 MED ORDER — SODIUM CHLORIDE 0.9 % IV SOLN: INTRAVENOUS | Status: AC

## 2016-07-09 MED ORDER — ACETAMINOPHEN 325 MG PO TABS
650.0000 mg | ORAL_TABLET | ORAL | Status: DC | PRN
  Administered 2016-07-09: 650 mg via ORAL
  Filled 2016-07-09: qty 2

## 2016-07-09 MED ORDER — HEPARIN SODIUM (PORCINE) 1000 UNIT/ML IJ SOLN
INTRAMUSCULAR | Status: AC
  Filled 2016-07-09: qty 1

## 2016-07-09 MED ORDER — PREDNISOLONE ACETATE 1 % OP SUSP
1.0000 [drp] | Freq: Every day | OPHTHALMIC | Status: DC
  Administered 2016-07-10: 10:00:00 1 [drp] via OPHTHALMIC
  Filled 2016-07-09: qty 1

## 2016-07-09 MED ORDER — SODIUM CHLORIDE 0.9 % IV SOLN: 250.0000 mL | INTRAVENOUS | Status: DC | PRN

## 2016-07-09 MED ORDER — POLYETHYL GLYCOL-PROPYL GLYCOL 0.4-0.3 % OP GEL: OPHTHALMIC | Status: DC | PRN

## 2016-07-09 MED ORDER — VERAPAMIL HCL 2.5 MG/ML IV SOLN
INTRAVENOUS | Status: DC | PRN
  Administered 2016-07-09: 2 mg via INTRAVENOUS

## 2016-07-09 MED ORDER — SODIUM CHLORIDE 0.9% FLUSH
3.0000 mL | Freq: Two times a day (BID) | INTRAVENOUS | Status: DC
  Administered 2016-07-09: 16:00:00 3 mL via INTRAVENOUS

## 2016-07-09 MED ORDER — SODIUM CHLORIDE 0.9 % IV SOLN
INTRAVENOUS | Status: DC
  Administered 2016-07-09: 08:00:00 via INTRAVENOUS

## 2016-07-09 MED ORDER — LIDOCAINE HCL (PF) 1 % IJ SOLN
INTRAMUSCULAR | Status: DC | PRN
  Administered 2016-07-09: 1 mL via INTRADERMAL

## 2016-07-09 MED ORDER — MONTELUKAST SODIUM 10 MG PO TABS
10.0000 mg | ORAL_TABLET | Freq: Every day | ORAL | Status: DC
  Administered 2016-07-10: 10:00:00 10 mg via ORAL
  Filled 2016-07-09: qty 1

## 2016-07-09 MED ORDER — ADENOSINE 12 MG/4ML IV SOLN
INTRAVENOUS | Status: AC
  Filled 2016-07-09: qty 16

## 2016-07-09 SURGICAL SUPPLY — 30 items
BALLN EMERGE MR 2.0X8 (BALLOONS) ×2
BALLN ~~LOC~~ MOZEC 2.5X8 (BALLOONS) ×2
BALLOON EMERGE MR 2.0X8 (BALLOONS) ×1 IMPLANT
BALLOON ~~LOC~~ MOZEC 2.5X8 (BALLOONS) ×1 IMPLANT
CATH INFINITI 5 FR 3DRC (CATHETERS) ×2 IMPLANT
CATH INFINITI 5 FR AR1 MOD (CATHETERS) ×2 IMPLANT
CATH INFINITI JR4 5F (CATHETERS) ×2 IMPLANT
CATH LAUNCHER 5F AL1 (CATHETERS) ×1 IMPLANT
CATH LAUNCHER 5F RADR (CATHETERS) ×1 IMPLANT
CATH MICROCATH NAVVUS (MICROCATHETER) ×1 IMPLANT
CATH OPTITORQUE TIG 4.0 5F (CATHETERS) ×2 IMPLANT
CATH SITESEER 5F NTR (CATHETERS) ×2 IMPLANT
CATH VISTA GUIDE 6FR JL3.5 (CATHETERS) ×2 IMPLANT
CATH VISTA GUIDE 6FR XBLAD3.0 (CATHETERS) ×2 IMPLANT
CATHETER LAUNCHER 5F AL1 (CATHETERS) ×2
CATHETER LAUNCHER 5F RADR (CATHETERS) ×2
DEVICE RAD COMP TR BAND LRG (VASCULAR PRODUCTS) ×2 IMPLANT
GLIDESHEATH SLEND A-KIT 6F 22G (SHEATH) ×2 IMPLANT
KIT ENCORE 26 ADVANTAGE (KITS) ×2 IMPLANT
KIT ESSENTIALS PG (KITS) ×2 IMPLANT
KIT HEART LEFT (KITS) ×2 IMPLANT
MICROCATHETER NAVVUS (MICROCATHETER) ×2
PACK CARDIAC CATHETERIZATION (CUSTOM PROCEDURE TRAY) ×2 IMPLANT
STENT SYNERGY DES 2.25X12 (Permanent Stent) ×2 IMPLANT
SYR MEDRAD MARK V 150ML (SYRINGE) ×2 IMPLANT
TRANSDUCER W/STOPCOCK (MISCELLANEOUS) ×2 IMPLANT
TUBING CIL FLEX 10 FLL-RA (TUBING) ×2 IMPLANT
WIRE HI TORQ BMW 190CM (WIRE) ×2 IMPLANT
WIRE HI TORQ VERSACORE-J 145CM (WIRE) ×2 IMPLANT
WIRE SAFE-T 1.5MM-J .035X260CM (WIRE) ×2 IMPLANT

## 2016-07-09 NOTE — Progress Notes (Signed)
TR BAND REMOVAL  LOCATION:    right radial  DEFLATED PER PROTOCOL:    Yes.    TIME BAND OFF / DRESSING APPLIED:    1630   SITE UPON ARRIVAL:    Level 0  SITE AFTER BAND REMOVAL:    Level 0  CIRCULATION SENSATION AND MOVEMENT:    Within Normal Limits   Yes.    COMMENTS:    

## 2016-07-09 NOTE — Care Management Note (Signed)
Case Management Note  Patient Details  Name: Leah Trujillo MRN: [id number] Date of Birth: [birth year]  Subjective/Objective:  S/p coronary stent intervention, will be on plavix and asa, NCM will cont to follow for dc needs.                   Action/Plan:   Expected Discharge Date:                  Expected Discharge Plan:  [place]  In-House Referral:     Discharge planning Services  CM Consult  Post Acute Care Choice:    Choice offered to:     DME Arranged:    DME Agency:     HH Arranged:    [place] Agency:     Status of Service:  In process, will continue to follow  If discussed at Long Length of Stay Meetings, dates discussed:    Additional Comments:  Zenon Mayo, RN 07/09/2016, 2:16 PM

## 2016-07-09 NOTE — Interval H&P Note (Signed)
History and Physical Interval Note:  07/09/2016 9:51 AM  Leah Trujillo  has presented today for surgery, with the diagnosis of presumptive ischemic cardiomyopathy with regional wall motion abnormality on echo, and progressive exertional dyspnea.  The various methods of treatment have been discussed with the patient and family. After consideration of risks, benefits and other options for treatment, the patient has consented to  Procedure(s): Left Heart Cath and Coronary Angiography (N/A) with possible Percutaneous Coronary Intervention as a surgical intervention .  The patient's history has been reviewed, patient examined, no change in status, stable for surgery.  I have reviewed the patient's chart and labs.  Questions were answered to the patient's satisfaction.    Cath Lab Visit (complete for each Cath Lab visit)  Clinical Evaluation Leading to the Procedure:   ACS: No.  Non-ACS:    Anginal Classification: CCS III  Anti-ischemic medical therapy: Minimal Therapy (1 class of medications)  Non-Invasive Test Results: High-risk stress test findings: cardiac mortality >3%/year  Prior CABG: No previous CABG   Glenetta Hew

## 2016-07-09 NOTE — H&P (View-Only) (Signed)
PCP: Tivis Ringer, MD  Clinic Note: Chief Complaint  Patient presents with  . New Patient (Initial Visit)    New diagnosis of LBBB, cardiac myopathy noted on echo  . Shortness of Breath    when walking     HPI: Leah Trujillo is a 82 y.o. female with a PMH below who presents today for initial cardiology consultation.  Extra heart Beats -- LBBB.  Echo done.    Leah Trujillo was Seen by her new PCP (Dr. Dagmar Hait) to establish care on 05/28/2016. During that visit, she noted (with the persistence of her daughter) that she has been noticing progressively worsening exertional dyspnea. He does note having significant mobility disability secondary to arthritis, but notes that in addition to her back and hips hurting her, she really is limited by shortness of breath simply walking in from the parking deck. She was noted on EKG to have a left bundle branch block and frequent PACs. This was evaluated with a chest x-ray that did not show any pulmonary edema, but an echocardiogram showed severely reduced EF as noted below. She now presents for cardiology evaluation.  Recent Hospitalizations: None  Studies Reviewed -- PSH updated  05/22/16 - Echo: Normal LV size. Moderate-severely reduced EF of 35-40% with diffuse HK. Only GR 1 DD noted.  Abdomen normal, dyssynchronous septal motion secondary to LBBB. Moderate TR with PA pressures estimated at 44 mmHg. (Moderate pulmonary hypertension.  Interval History: Loda herself is not the most forthcoming historian, most of the history is given by her daughter. She recently moved from [facility] very (roughly a year ago). He had been followed by a ECP they're routinely, but had never had a cardiology evaluation. She notes persistent exertional dyspnea that is limiting in addition to her back pain. She denies any PND orthopnea or edema however. She also denies any rapid or irregular heartbeat/palpitations. Nothing to suggest  any arrhythmias. She has never had any symptoms of chest tightness or pressure with rest or exertion.  She occasionally may notice some positional dizziness, but no syncope or near syncope, TIA/amaurosis fugax.  No melena, hematochezia, hematuria, or epstaxis. No claudication.   ROS: A comprehensive was performed. Review of Systems  Constitutional: Negative for malaise/fatigue (Just somewhat deconditioned due to lack of activity.).  HENT: Positive for hearing loss (Cannot hear very well out of left ear). Negative for congestion and nosebleeds.   Eyes:       Can only see light out of left eye. Has had 3 retinal transplants on right eye.  Respiratory: Positive for shortness of breath (With exertion) and wheezing (With lying down.). Negative for cough.   Gastrointestinal: Positive for heartburn (Takes Prilosec 2 times a day that controls relatively well.). Negative for blood in stool, constipation and melena.  Genitourinary: Negative for dysuria, frequency (She does have nocturia that wakes her up in the middle of night.) and hematuria.  Musculoskeletal: Positive for back pain, joint pain (Bilateral knee) and neck pain. Negative for myalgias.  Neurological: Positive for dizziness (Some mild positional). Negative for focal weakness, loss of consciousness and headaches.  Endo/Heme/Allergies: Negative for environmental allergies. Does not bruise/bleed easily.  Psychiatric/Behavioral: Negative for memory loss. The patient is not nervous/anxious and does not have insomnia.     Past Medical History:  Diagnosis Date  . DJD (degenerative joint disease), thoracolumbar    Chronic back pain but also neck and knees  . Dysthymia    Well-controlled on Celexa  .  GERD (gastroesophageal reflux disease)   . Hypertension   . Hypothyroidism (acquired)    On thyroid replacement  . Impaired fasting glucose    He will A1c 5.7 from August 2017  . LBBB (left bundle branch block)   . OAB (overactive bladder)      Past Surgical History:  Procedure Laterality Date  . TRANSTHORACIC ECHOCARDIOGRAM  05/17/2016   Normal LV size. Moderate-severely reduced EF of 35-40% with diffuse HK. Only GR 1 DD noted.  Abdomen normal, dyssynchronous septal motion secondary to LBBB. Moderate TR with PA pressures estimated at 44 mmHg. (Moderate pulmonary hypertension.     Prior to Admission medications   Medication Sig Start Date End Date Taking? Authorizing Provider  aspirin 81 MG tablet Take 81 mg by mouth daily.   Yes Historical Provider, MD  citalopram (CELEXA) 10 MG tablet Take 10 mg by mouth daily. 03/13/16  Yes Historical Provider, MD  desonide (DESOWEN) 0.05 % cream Apply 1 application topically 2 (two) times daily. 05/13/16  Yes Historical Provider, MD  levothyroxine (SYNTHROID, LEVOTHROID) 50 MCG tablet Take 50 mcg by mouth daily. 04/01/16  Yes Historical Provider, MD  loratadine (CLARITIN) 10 MG tablet Take 10 mg by mouth daily.   Yes Historical Provider, MD  losartan (COZAAR) 50 MG tablet Take 50 mg by mouth daily. 05/07/16  Yes Historical Provider, MD  montelukast (SINGULAIR) 10 MG tablet Take 10 mg by mouth daily. 05/07/16  Yes Historical Provider, MD  omeprazole (PRILOSEC) 20 MG capsule Take 20 mg by mouth 2 (two) times daily. 03/14/16  Yes Historical Provider, MD  pravastatin (PRAVACHOL) 80 MG tablet Take 80 mg by mouth at bedtime. 03/20/16  Yes Historical Provider, MD  prednisoLONE acetate (PRED FORTE) 1 % ophthalmic suspension Place 1 drop into both eyes daily. 03/15/16  Yes Historical Provider, MD    Allergies  Allergen Reactions  . Codeine Hives    Social History   Social History  . Marital status: Widowed    Spouse name: N/A  . Number of children: 1  . Years of education: 47   Occupational History  .      Former Programmer, applications   Social History Main Topics  . Smoking status: Never Smoker  . Smokeless tobacco: Never Used  . Alcohol use 0.6 oz/week    1 Standard drinks or equivalent per week    . Drug use: No  . Sexual activity: No   Other Topics Concern  . None   Social History Narrative   Widowed mother of one, 2 grandchildren and one great-grandchild.   Recently moved [place] in 2016 to live close to her daughter. Currently lives alone and is relatively independent of ADLs. She does minimal work around the house.   He previously worked Water quality scientist a Hospital doctor. He did one year of business school after high school.   Does minimal walking maybe 15 minutes a week mostly because of back pain.   She does not smoke. Never smoked. Drinks one at all drink a week.    Family History  Problem Relation Age of Onset  . Hypertension Mother   . Hyperlipidemia Mother   . Heart disease Mother     Pacemaker  . Heart disease Father     Valve disorder  . Kidney Stones Father   . AAA (abdominal aortic aneurysm) Father   . Stroke Maternal Grandmother   . Heart attack Maternal Grandfather   . Heart disease Paternal Grandmother     Wt Readings  from Last 3 Encounters:  06/24/16 65.3 kg (144 lb)    PHYSICAL EXAM BP 132/80 (BP Location: Left Arm, Patient Position: Sitting)   Pulse 87   Ht 4' 11.5" (1.511 m)   Wt 65.3 kg (144 lb)   BMI 28.60 kg/m  General appearance: alert, cooperative, appears stated age, no distress and borderline obese.  Well-nourished and well-groomed. HEENT: [place]/AT, EOMI, MMM, anicteric sclera - pupils are round and minimally reactive to light. Right is more reactive than left. Neck: no adenopathy, no carotid bruit and no JVD Lungs: clear to auscultation bilaterally with the exception of faint bibasilar crackles. Normal percussion bilaterally and non-labored Heart: regular rate and rhythm, S1 normal, and split S2 normal, soft 1/6 HSM at LUSB. no click, rub or gallop; nondisplaced PMI Abdomen: soft, non-tender; bowel sounds normal; no masses,  no organomegaly; no HJR Extremities: extremities normal, atraumatic, no cyanosis, and trivial edema  Pulses: 2+ and  symmetric;  Skin: normal, mobility and turgor normal, no edema, no evidence of bleeding or bruising and no lesions noted Neurologic: Mental status: Alert, oriented, thought content appropriate; normal mood and affect. Poor historian. Partly because of poor hearing Cranial nerves: normal (II-XII grossly intact)    Adult ECG Report  Rate: 87 ;  Rhythm: normal sinus rhythm, premature atrial contractions (PAC) and LBBB. Otherwise normal axis, intervals and durations.  Narrative Interpretation: Essentially stable EKG from PCP. Cannot exclude inferior infarct, with Q waves in lead 3 only. Not noted in 2 or aVF. Axis is -22.   Other studies Reviewed: Additional studies/ records that were reviewed today include:  Recent Labs:  No labs available currently    ASSESSMENT / PLAN: Problem List Items Addressed This Visit    Left bundle branch block (Chronic)    Not is at the shoulder with the duration of left bundle-branch is. It is possible that the bundle branch itself is the reason for her cardiomyopathy, however we need to exclude an ischemic   Plan: Left heart catheterization based on echo cardiac exam finding.      Relevant Orders   EKG 12-Lead   LEFT HEART CATHETERIZATION WITH CORONARY ANGIOGRAM   APTT   Protime-INR   Basic metabolic panel   Dyspnea    Exertional dyspnea quite likely related to cardiomyopathy. I would like to add carvedilol 3.5 mg twice a day, but will wait until he performed a catheterization to determine her volume status to start this, and also likely start a standing Lasix dose.      Relevant Orders   EKG 12-Lead   LEFT HEART CATHETERIZATION WITH CORONARY ANGIOGRAM   APTT   Protime-INR   Basic metabolic panel   Cardiomyopathy, cannot exclude ischemic - Primary    New diagnosis of cardiomyopathy with an EF of 35-40% and global hypokinesis. With a left bundle branch block on EKG, we need to exclude an ischemic etiology. She has progressively worsening dyspnea over  the last few months that is limiting in addition to her arthritic disease. With a left bundle branch block, Myoview would not be a very accurate test with high likelihood of false positive findings.  I'm concerned that her dyspnea could very well be an anginal equivalent. With this in mind and the high likelihood of false positive finding on stress test, I think the best course of action is to proceed with cardiac catheterization for diagnostic purposes. Caryl Comes I had a long talk with the patient and her daughter about what diagnostic procedures we could proceed  with including simple medical management, nuclear stress test versus cardiac catheterization. It is my opinion that catheterization is probably the best option. She agreed that she would prefer to skip the stress test and go directly to cardiac catheterization. She did indicate however that she would not be distended and CABG it becomes to that.  Plan:   Continue current dose of losartan. Would likely start carvedilol 3.125 mg twice a day plus or minus standing dose of Lasix based on catheterization result.  Schedule left heart catheterization, likely next week. She will need preop labs. We'll plan right radial access.  Performing MD:  Glenetta Hew, M.D., M.S.  Procedure:  Left Heart Catheterization with Coronary Angiography and Possible Percutaneous Coronary Intervention   The procedure with Risks/Benefits/Alternatives and Indications was reviewed with the patient and her daughter.  All questions were answered.    Risks / Complications include, but not limited to: Death, MI, CVA/TIA, VF/VT (with defibrillation), Bradycardia (need for temporary pacer placement), contrast induced nephropathy, bleeding / bruising / hematoma / pseudoaneurysm, vascular or coronary injury (with possible emergent CT or Vascular Surgery), adverse medication reactions, infection.  Additional risks involving the use of radiation with the possibility of radiation burns  and cancer were explained in detail.  The patient and daughter voice understanding and agree to proceed.         Angina, class III ([place]) - with dyspnea as the main symptom    She really doesn't have chest pain, her symptoms more exertional dyspnea with reduced EF. I'm concerned that this could be a potential anginal equivalent.      Relevant Orders   EKG 12-Lead   LEFT HEART CATHETERIZATION WITH CORONARY ANGIOGRAM   APTT   Protime-INR   Basic metabolic panel    Other Visit Diagnoses   None.     Current medicines are reviewed at length with the patient today. (+/- concerns)  The following changes have been made: No changes at present  Patient Instructions  PLEASE HAVE THESE LABS DONE: BMP, PTT, Pt AND CBC  Your physician has requested that you have a cardiac catheterization. Cardiac catheterization is used to diagnose and/or treat various heart conditions. Doctors may recommend this procedure for a number of different reasons. The most common reason is to evaluate chest pain. Chest pain can be a symptom of coronary artery disease (CAD), and cardiac catheterization can show whether plaque is narrowing or blocking your heart's arteries. This procedure is also used to evaluate the valves, as well as measure the blood flow and oxygen levels in different parts of your heart. For further information please visit [facility]. Please follow instruction sheet, as given.  SCHEDULE CATH WITH DR [facility]   Your physician recommends that you schedule a follow-up appointment in: [place]    Studies Ordered:   Orders Placed This Encounter  Procedures  . APTT  . Protime-INR  . Basic metabolic panel  . EKG 12-Lead  . LEFT HEART CATHETERIZATION WITH CORONARY Illene Silver, M.D., M.S. Interventional Cardiologist   Pager # [phone] Phone # [phone] [address] [place], [place] [address]

## 2016-07-09 NOTE — Research (Signed)
CADLAD Informed Consent   Subject Name: Leah Trujillo  Subject met inclusion and exclusion criteria.  The informed consent form, study requirements and expectations were reviewed with the subject and questions and concerns were addressed prior to the signing of the consent form.  The subject verbalized understanding of the trail requirements.  The subject agreed to participate in the CADLAD trial and signed the informed consent.  The informed consent was obtained prior to performance of any protocol-specific procedures for the subject.  A copy of the signed informed consent was given to the subject and a copy was placed in the subject's medical record.  Berneda Rose 07/09/2016, 8:13 AM

## 2016-07-10 ENCOUNTER — Encounter (HOSPITAL_COMMUNITY): Payer: Self-pay | Admitting: Nurse Practitioner

## 2016-07-10 DIAGNOSIS — I5042 Chronic combined systolic (congestive) and diastolic (congestive) heart failure: Secondary | ICD-10-CM | POA: Insufficient documentation

## 2016-07-10 DIAGNOSIS — I25110 Atherosclerotic heart disease of native coronary artery with unstable angina pectoris: Secondary | ICD-10-CM | POA: Diagnosis not present

## 2016-07-10 DIAGNOSIS — F341 Dysthymic disorder: Secondary | ICD-10-CM | POA: Diagnosis not present

## 2016-07-10 DIAGNOSIS — I209 Angina pectoris, unspecified: Secondary | ICD-10-CM

## 2016-07-10 DIAGNOSIS — Z79890 Hormone replacement therapy (postmenopausal): Secondary | ICD-10-CM | POA: Diagnosis not present

## 2016-07-10 DIAGNOSIS — G8929 Other chronic pain: Secondary | ICD-10-CM | POA: Diagnosis not present

## 2016-07-10 DIAGNOSIS — K219 Gastro-esophageal reflux disease without esophagitis: Secondary | ICD-10-CM | POA: Diagnosis not present

## 2016-07-10 DIAGNOSIS — I110 Hypertensive heart disease with heart failure: Secondary | ICD-10-CM | POA: Diagnosis not present

## 2016-07-10 DIAGNOSIS — I272 Pulmonary hypertension, unspecified: Secondary | ICD-10-CM | POA: Diagnosis not present

## 2016-07-10 DIAGNOSIS — I5023 Acute on chronic systolic (congestive) heart failure: Secondary | ICD-10-CM | POA: Diagnosis not present

## 2016-07-10 DIAGNOSIS — R7301 Impaired fasting glucose: Secondary | ICD-10-CM | POA: Diagnosis not present

## 2016-07-10 DIAGNOSIS — M47895 Other spondylosis, thoracolumbar region: Secondary | ICD-10-CM | POA: Diagnosis not present

## 2016-07-10 DIAGNOSIS — I428 Other cardiomyopathies: Secondary | ICD-10-CM | POA: Diagnosis not present

## 2016-07-10 DIAGNOSIS — E039 Hypothyroidism, unspecified: Secondary | ICD-10-CM | POA: Diagnosis not present

## 2016-07-10 LAB — CBC
HCT: 41.4 % (ref 36.0–46.0)
HEMOGLOBIN: 13.4 g/dL (ref 12.0–15.0)
MCH: 30.9 pg (ref 26.0–34.0)
MCHC: 32.4 g/dL (ref 30.0–36.0)
MCV: 95.6 fL (ref 78.0–100.0)
PLATELETS: 256 10*3/uL (ref 150–400)
RBC: 4.33 MIL/uL (ref 3.87–5.11)
RDW: 13.9 % (ref 11.5–15.5)
WBC: 8.5 10*3/uL (ref 4.0–10.5)

## 2016-07-10 LAB — BASIC METABOLIC PANEL
ANION GAP: 8 (ref 5–15)
BUN: 16 mg/dL (ref 6–20)
CALCIUM: 9.3 mg/dL (ref 8.9–10.3)
CO2: 27 mmol/L (ref 22–32)
CREATININE: 0.99 mg/dL (ref 0.44–1.00)
Chloride: 104 mmol/L (ref 101–111)
GFR, EST AFRICAN AMERICAN: 60 mL/min — AB (ref >60)
GFR, EST NON AFRICAN AMERICAN: 52 mL/min — AB (ref >60)
GLUCOSE: 105 mg/dL — AB (ref 65–99)
Potassium: 4.4 mmol/L (ref 3.5–5.1)
Sodium: 139 mmol/L (ref 135–145)

## 2016-07-10 MED ORDER — NITROGLYCERIN 0.4 MG SL SUBL: 0.4000 mg | SUBLINGUAL_TABLET | SUBLINGUAL | 3 refills | Status: AC | PRN

## 2016-07-10 MED ORDER — CLOPIDOGREL BISULFATE 75 MG PO TABS: 75.0000 mg | ORAL_TABLET | Freq: Every day | ORAL | 6 refills | Status: DC

## 2016-07-10 MED ORDER — CARVEDILOL 3.125 MG PO TABS
3.1250 mg | ORAL_TABLET | Freq: Two times a day (BID) | ORAL | Status: DC
  Administered 2016-07-10: 3.125 mg via ORAL
  Filled 2016-07-10: qty 1

## 2016-07-10 MED ORDER — ANGIOPLASTY BOOK
Freq: Once | Status: DC
  Filled 2016-07-10: qty 1

## 2016-07-10 MED ORDER — CARVEDILOL 3.125 MG PO TABS: 3.1250 mg | ORAL_TABLET | Freq: Two times a day (BID) | ORAL | 6 refills | Status: DC

## 2016-07-10 MED ORDER — PANTOPRAZOLE SODIUM 40 MG PO TBEC: 40.0000 mg | DELAYED_RELEASE_TABLET | Freq: Every day | ORAL | 6 refills | Status: DC

## 2016-07-10 MED ORDER — FUROSEMIDE 40 MG PO TABS: 40.0000 mg | ORAL_TABLET | Freq: Every day | ORAL | 6 refills | Status: DC

## 2016-07-10 NOTE — Progress Notes (Signed)
CARDIAC REHAB PHASE I   PRE:  Rate/Rhythm: 86 SR c/ PVCs  BP:  Sitting: 126/61        SaO2: 95 RA  MODE:  Ambulation: 300 ft   POST:  Rate/Rhythm: 103 ST c/ PVCs  BP:  Sitting: 149/56         SaO2: 96 RA  Pt ambulated 300 ft on RA, handheld assist, steady gait, tolerated well.  Pt c/o mild DOE, states it is improved, denies cp, dizziness, declined rest stop. Completed PCI/stent/CHF education with pt and daughter at bedside.  Reviewed risk factors, anti-platelet therapy, stent card, activity restrictions, ntg, exercise, heart healthy diet, sodium restrictions, CHF booklet and zone tool, daily weights, and phase 2 cardiac rehab. Pt and daughter verbalized understanding, receptive to education. Pt agrees to phase 2 cardiac rehab referral, will send to [facility] per pt request. Pt to recliner after walk, call bell within reach.   EZ:[id number] Lenna Sciara, RN, BSN 07/10/2016 9:20 AM

## 2016-07-10 NOTE — Discharge Summary (Signed)
Discharge Summary    Patient ID: Leah Trujillo,  MRN: [id number], DOB/AGE: 1931/09/01 82 y.o.  Admit date: 07/09/2016 Discharge date: 07/10/2016  Primary Care Provider: Tivis Ringer Primary Cardiologist: Roni Bread, MD   Discharge Diagnoses    Principal Problem:   Angina, class III ([place]) - with dyspnea as the main symptom  **s/p PCI/DES of the proximal LAD this admission.  Active Problems:   Dyspnea   CAD S/P percutaneous coronary angioplasty   Acute on chronic systolic CHF (congestive heart failure) (HCC)   Left bundle branch block   Allergies Allergies  Allergen Reactions  . Codeine Rash    Diagnostic Studies/Procedures    Cardiac Catheterization and Percutaneous Coronary Intervention 11.7.2017  Left Main Vessel is large.  Left Anterior Descending Vessel is moderate in size. Prox LAD lesion, 75% stenosed. Culprit lesion. The lesion is located at the major branch and eccentric. The stenosis was measured by a visual reading. Pressure wire/FFR was performed on the lesion. FFR: 0.79.   The LAD was successfully stented using a 2.25 x 12 mm Synergy DES. Mid LAD lesion, 55% stenosed. The lesion is tubular and eccentric. First Diagonal Branch Vessel is small in size. Ost 1st Diag lesion, 50% stenosed. The lesion is located at the major branch. First Septal Branch Vessel is small in size. Second Diagonal Branch Vessel is small in size. Second Septal Branch Vessel is small in size. Third Septal Branch Vessel is small in size.  Left Circumflex Vessel is moderate in size. Vessel is angiographically normal. First Obtuse Marginal Branch Vessel is angiographically normal. Lateral First Obtuse Marginal Branch Vessel is small in size. Third Obtuse Marginal Branch Vessel is small in size.  Right Coronary Artery Vessel was visualized by non-selective angiography. Vessel is large. The vessel exhibits minimal luminal irregularities. High anterior takeoff just after a lip of calcium -  very difficult to engage The vessel is moderately tortuous. Right Posterior Descending Artery Vessel is moderate in size. Right Posterior Atrioventricular Branch Vessel is small in size. Left Ventricle There is moderate to severe left ventricular systolic dysfunction. EF roughly 35% by echo LV end diastolic pressure is severely elevated. 25-30 mmHg  History of Present Illness     82 year old female without prior cardiac history. Over the past few months, she's been experiencing dyspnea on exertion and also mild weight gain. She was evaluated by primary care with an echo performed and revealing an EF of 35-40% with diffuse hypokinesis and grade 1 diastolic dysfunction. A new left bundle branch block was also noted. She was referred to cardiology and evaluated by Dr. Ellyn Hack recently, with plan for diagnostic catheterization.  Hospital Course     Consultants: None   Patient underwent diagnostic cardiac catheterization on 07/09/2016, revealing significant proximal LAD disease of approximate 75% and otherwise nonobstructive CAD. EF was 35%. Left ventricular end-diastolic pressure was elevated at 25-30 mmHg. Pressure wire measurements were taken within the LAD and revealed a significant stenosis with a fractional flow reserve of 0.79. Thus, this area was successfully treated using a 2.25 x 12 mm Synergy drug-eluting stent. Patient tolerated procedure well and post procedure was given a dose of IV Lasix and subsequently started on oral Lasix. She had reasonable diuresis with symptomatic improvement. This morning, she has been ambulating without significant limitations or chest pain. She will be discharged home today in good condition. We have initiated low-dose beta blocker therapy in addition to oral Lasix therapy. She will need a follow-up basic metabolic  panel when she is seen in clinic on November 13. If she tolerates beta blocker therapy well, we may consider transitioning from losartan to [facility] and  subsequently add spironolactone. She will need a follow-up echo in 90 days. _____________  Discharge Vitals Blood pressure 126/61, pulse 88, temperature 97.7 F (36.5 C), temperature source Oral, resp. rate 15, height 4' 11.5" (1.511 m), weight 140 lb (63.5 kg), SpO2 93 %.  Filed Weights   07/09/16 0716  Weight: 140 lb (63.5 kg)    Labs & Radiologic Studies    CBC  Recent Labs  07/09/16 0807 07/10/16 0406  WBC 5.7 8.5  HGB 13.7 13.4  HCT 41.0 41.4  MCV 94.9 95.6  PLT 149* [id number]   Basic Metabolic Panel  Recent Labs  07/10/16 0406  NA 139  K 4.4  CL 104  CO2 27  GLUCOSE 105*  BUN 16  CREATININE 0.99  CALCIUM 9.3  _____________   Disposition   Pt is being discharged home today in good condition.  Follow-up Plans & Appointments    Follow-up Information    Leah Trujillo, Leah Trujillo Follow up on 07/15/2016.   Specialties:  Cardiology, Radiology Why:  8:30 AM - Dr. Allison Quarry PA. Contact information: [address] [place] [address] [phone]          Discharge Instructions    Amb Referral to Cardiac Rehabilitation    Complete by:  As directed    Diagnosis:  Coronary Stents Comment - To [place]      Discharge Medications   Current Discharge Medication List    START taking these medications   Details  carvedilol (COREG) 3.125 MG tablet Take 1 tablet (3.125 mg total) by mouth 2 (two) times daily with a meal. Qty: 60 tablet, Refills: 6    clopidogrel (PLAVIX) 75 MG tablet Take 1 tablet (75 mg total) by mouth daily with breakfast. Qty: 30 tablet, Refills: 6    furosemide (LASIX) 40 MG tablet Take 1 tablet (40 mg total) by mouth daily. Qty: 30 tablet, Refills: 6    nitroGLYCERIN (NITROSTAT) 0.4 MG SL tablet Place 1 tablet (0.4 mg total) under the tongue every 5 (five) minutes as needed for chest pain. Qty: 25 tablet, Refills: 3    pantoprazole (PROTONIX) 40 MG tablet Take 1 tablet (40 mg total) by mouth daily. Qty: 30 tablet, Refills:  6      CONTINUE these medications which have NOT CHANGED   Details  aspirin 81 MG tablet Take 81 mg by mouth daily.    citalopram (CELEXA) 10 MG tablet Take 10 mg by mouth daily. Refills: 3    desonide (DESOWEN) 0.05 % cream Apply 1 application topically daily. Applies to ears Refills: 3    levothyroxine (SYNTHROID, LEVOTHROID) 50 MCG tablet Take 50 mcg by mouth at bedtime.  Refills: 4    loratadine (CLARITIN) 10 MG tablet Take 10 mg by mouth daily.    losartan (COZAAR) 50 MG tablet Take 50 mg by mouth daily. Refills: 2    montelukast (SINGULAIR) 10 MG tablet Take 10 mg by mouth daily. Refills: 2    Multiple Vitamin (MULTIVITAMIN WITH MINERALS) TABS tablet Take 1 tablet by mouth daily.    Polyethyl Glycol-Propyl Glycol (SYSTANE OP) Place 1 drop into both eyes every 4 (four) hours as needed (dry eyes).    pravastatin (PRAVACHOL) 80 MG tablet Take 80 mg by mouth at bedtime. Refills: 3    prednisoLONE acetate (PRED FORTE) 1 % ophthalmic suspension  Place 1 drop into both eyes daily. Refills: 2      STOP taking these medications     omeprazole (PRILOSEC) 20 MG capsule          Outstanding Labs/Studies   F/u BMET @ f/u visit on 11/13. Plan for follow-up echo in approximately 90 days.  Duration of Discharge Encounter   Greater than 30 minutes including physician time.  Signed, Murray Hodgkins NP 07/10/2016, 11:16 AM  Attending Note:   The patient was seen and examined.  Agree with assessment and plan as noted above.  Changes made to the above note as needed.  Patient seen and independently examined with Ignacia Bayley, NP.   We discussed all aspects of the encounter. I agree with the assessment and plan as stated above.  Pt is stable for DC  See progress note from earlier today    I have spent a total of 40 minutes with patient reviewing hospital  notes , telemetry, EKGs, labs and examining patient as well as establishing an assessment and plan that was  discussed with the patient. > 50% of time was spent in direct patient care.    Thayer Headings, Brooke Bonito., MD, [facility] 07/10/2016, 6:38 PM [address],  [place] Pager [phone]

## 2016-07-10 NOTE — Discharge Instructions (Signed)
**  PLEASE REMEMBER TO BRING ALL OF YOUR MEDICATIONS TO EACH OF YOUR FOLLOW-UP OFFICE VISITS. ° °NO HEAVY LIFTING OR SEXUAL ACTIVITY X 7 DAYS. °NO DRIVING X 3-5 DAYS. °NO SOAKING BATHS, HOT TUBS, POOLS, ETC., X 7 DAYS.  ° °Groin Site Care °Refer to this sheet in the next few weeks. These instructions provide you with information on caring for yourself after your procedure. Your caregiver may also give you more specific instructions. Your treatment has been planned according to current medical practices, but problems sometimes occur. Call your caregiver if you have any problems or questions after your procedure. °HOME CARE INSTRUCTIONS °· You may shower 24 hours after the procedure. Remove the bandage (dressing) and gently wash the site with plain soap and water. Gently pat the site dry.  °· Do not apply powder or lotion to the site.  °· Do not sit in a bathtub, swimming pool, or whirlpool for 5 to 7 days.  °· No bending, squatting, or lifting anything over 10 pounds (4.5 kg) as directed by your caregiver.  °· Inspect the site at least twice daily.  °· Do not drive home if you are discharged the same day of the procedure. Have someone else drive you.  °· You may drive 24 hours after the procedure unless otherwise instructed by your caregiver.  °What to expect: °· Any bruising will usually fade within 1 to 2 weeks.  °· Blood that collects in the tissue (hematoma) may be painful to the touch. It should usually decrease in size and tenderness within 1 to 2 weeks.  °SEEK IMMEDIATE MEDICAL CARE IF: °· You have unusual pain at the groin site or down the affected leg.  °· You have redness, warmth, swelling, or pain at the groin site.  °· You have drainage (other than a small amount of blood on the dressing).  °· You have chills.  °· You have a fever or persistent symptoms for more than 72 hours.  °· You have a fever and your symptoms suddenly get worse.  °· Your leg becomes pale, cool, tingly, or numb.  °You have heavy  bleeding from the site. Hold pressure on the site.  °

## 2016-07-10 NOTE — Progress Notes (Signed)
Patient Name: Leah Trujillo Date of Encounter: 07/10/2016  Primary Cardiologist: Roni Bread, MD   Hospital Problem List     Principal Problem:   Angina, class III ([place]) - with dyspnea as the main symptom Active Problems:   Dyspnea   CAD S/P percutaneous coronary angioplasty   Acute on chronic systolic CHF (congestive heart failure) (HCC)   Left bundle branch block  Subjective   No chest pain overnight.  Walked 300 ft this AM with Cardiac rehab.  Thinks dyspnea was a little less pronounced.  Inpatient Medications    . angioplasty book   Does not apply Once  . aspirin EC  81 mg Oral Daily  . clopidogrel  75 mg Oral Q breakfast  . furosemide  40 mg Oral Daily  . levothyroxine  50 mcg Oral QHS  . losartan  50 mg Oral Daily  . montelukast  10 mg Oral Daily  . pantoprazole  40 mg Oral Daily  . pravastatin  80 mg Oral QHS  . prednisoLONE acetate  1 drop Both Eyes Daily  . sodium chloride flush  3 mL Intravenous Q12H    Vital Signs    Vitals:   07/09/16 1954 07/09/16 2300 07/10/16 0400 07/10/16 0800  BP: (!) 142/67 (!) 109/33 (!) 110/47 126/61  Pulse: 84 (!) 53 80 88  Resp: 16 17 16 15   Temp: 97.4 F (36.3 C)  97.4 F (36.3 C) 97.7 F (36.5 C)  TempSrc: Oral  Oral Oral  SpO2: 100% 92% 93% 93%  Weight:      Height:        Intake/Output Summary (Last 24 hours) at 07/10/16 0916 Last data filed at 07/09/16 1950  Gross per 24 hour  Intake           1012.5 ml  Output             1675 ml  Net           -662.5 ml   Filed Weights   07/09/16 0716  Weight: 140 lb (63.5 kg)    Physical Exam   GEN: Well nourished, well developed, in no acute distress.  HEENT: Grossly normal.  Neck: Supple, JVP ~ 12 cm.  No carotid bruits, or masses. Cardiac: Irreg - freq ectopy, no murmurs, rubs, or gallops. No clubbing, cyanosis, edema.  Radials/DP/PT 2+ and equal bilaterally.  Respiratory:  Respirations regular and unlabored, bibasilar crackles. GI: Soft, nontender,  nondistended, BS + x 4. MS: no deformity or atrophy. Skin: warm and dry, no rash. Neuro:  Strength and sensation are intact. Psych: AAOx3.  Normal affect.  Labs    CBC  Recent Labs  07/09/16 0807 07/10/16 0406  WBC 5.7 8.5  HGB 13.7 13.4  HCT 41.0 41.4  MCV 94.9 95.6  PLT 149* 123456   Basic Metabolic Panel  Recent Labs  07/10/16 0406  NA 139  K 4.4  CL 104  CO2 27  GLUCOSE 105*  BUN 16  CREATININE 0.99  CALCIUM 9.3    Telemetry    RSR, freq PAC's, brief run of PAT.  ECG    RSR, 81, PAC's, LAD, LBBB.  Radiology    No results found.  Patient Profile     82 y/o ? admitted 11/7 after a several month h/o DOE, new LBBB, and finding of LV dysfxn, with cath revealing prox LAD dzs req DES.  Assessment & Plan    1.  Unstable Angina/DOE/CAD:  S/p cath revealing severe prox  LAD dzs with FFR of 0.79.  PCI/DES performed. No c/p overnight.  Breathing a little better this AM.  Cont ASA, statin, plavix, ARB.  Add low dose  blocker and sl ntg.  2.  Acute on chronic systolic CHF:  EDP 32 mmHg on cath.  Received IV lasix yesterday.  Minus 662 ml, though had some incontinence as well.  Says she weighed about 135 3-6 mos ago.  Now 140.  On exam, still w/ JVD and bibasilar crackles.  As planned by Dr. Ellyn Hack, lasix 40 PO daily started this AM.  Renal fxn stable.  Plan for d/c later this AM and f/u with Almyra Deforest on Monday.  If bp stable on  blocker and ARB, could look to transition to entresto as outpt.  3.  Lipids:  Status not currently known.  On prava 80.  Followed by primary care.  She will see him next week.  If ldl > 70, would rec switching to lipitor 80.  Signed, Murray Hodgkins NP 07/10/2016, 9:16 AM   Attending Note:   The patient was seen and examined.  Agree with assessment and plan as noted above.  Changes made to the above note as needed.  Patient seen and independently examined with Ignacia Bayley, NP .   We discussed all aspects of the encounter. I agree with  the assessment and plan as stated above.  Pt is doing well. Ready for DC .  Will follow up with Dr. Ellyn Hack    I have spent a total of 40 minutes with patient reviewing hospital  notes , telemetry, EKGs, labs and examining patient as well as establishing an assessment and plan that was discussed with the patient. > 50% of time was spent in direct patient care.    Thayer Headings, Brooke Bonito., MD, [facility] 07/10/2016, 10:46 AM [address],  [place] Pager [phone]

## 2016-07-15 ENCOUNTER — Ambulatory Visit (INDEPENDENT_AMBULATORY_CARE_PROVIDER_SITE_OTHER): Payer: Medicare Other | Admitting: Student

## 2016-07-15 ENCOUNTER — Encounter: Payer: Self-pay | Admitting: Student

## 2016-07-15 VITALS — BP 116/68 | HR 88 | Ht 59.5 in | Wt 141.4 lb

## 2016-07-15 DIAGNOSIS — I10 Essential (primary) hypertension: Secondary | ICD-10-CM

## 2016-07-15 DIAGNOSIS — I2510 Atherosclerotic heart disease of native coronary artery without angina pectoris: Secondary | ICD-10-CM | POA: Diagnosis not present

## 2016-07-15 DIAGNOSIS — I255 Ischemic cardiomyopathy: Secondary | ICD-10-CM

## 2016-07-15 DIAGNOSIS — E785 Hyperlipidemia, unspecified: Secondary | ICD-10-CM

## 2016-07-15 DIAGNOSIS — I209 Angina pectoris, unspecified: Secondary | ICD-10-CM

## 2016-07-15 DIAGNOSIS — I447 Left bundle-branch block, unspecified: Secondary | ICD-10-CM

## 2016-07-15 DIAGNOSIS — Z9861 Coronary angioplasty status: Secondary | ICD-10-CM | POA: Diagnosis not present

## 2016-07-15 DIAGNOSIS — I5022 Chronic systolic (congestive) heart failure: Secondary | ICD-10-CM

## 2016-07-15 NOTE — Progress Notes (Signed)
Cardiology Office Note    Date:  07/15/2016   ID:  Leah Trujillo, DOB [birth year], MRN [id number]  PCP:  Tivis Ringer, MD  Cardiologist: Dr. Ellyn Hack  Chief Complaint  Patient presents with  . Follow-up    History of Present Illness:    Leah Trujillo is a 82 y.o. female with past medical history of CAD (s/p recent DES to pLAD in [id number]), chronic systolic CHF (EF 123456 by echo in 05/2016), HTN, HLD, and known LBBB who presents to the office today for hospital follow-up.   She had been seen by Dr. Ellyn Hack in 06/2016 for worsening dyspnea with exertion. EKG showed a new LBBB with recent echo showing an EF of 35-40% with diffuse HK. A cardiac catheterization was recommended and she presented to [facility] on 07/09/2016 for the procedure. Cath showed 75% prox LAD stenosis with an FFR of 0.79, therefore a 2.25 x 12 mm Synergy DES was placed. Diffuse disease was also noted with a 55% stenosis along the mid LAD and 50% stenosis of the Ost 1st Diag. EF estimated at [id number] with end diastolic filling pressure elevated to 25-30 mmHg. She was started on DAPT with ASA and Plavix along with a BB, Losartan and Lasix.   Today, she reports doing well following her recent stent placement. Denies any chest discomfort and says her dyspnea with exertion has significantly improved. Reports good compliance with her medications, including her ASA and Plavix. Denies any evidence of active bleeding.   She has been weighing herself daily with weight remaining stable at 140 lbs. She does not check her BP at home. Notes occasional episodes of dizziness, not worse with positional changes. No prodromal symptoms. Denies any recent falls or presyncope. Has been able to perform ADL's independently.    Past Medical History:  Diagnosis Date  . Chronic radicular pain of lower back   . Chronic systolic CHF (congestive heart failure) ([place])    a. 05/2016 Echo: EF 35-40%, diff HK, Gr1 DD.  Marland Kitchen Complication of  anesthesia    "takes alot to put me down" (07/09/2016)  . Coronary artery disease    a. 07/2016 Cath: LM nl, LAD 75p (FFR 0.79 -> 2.25 x 12 Synergy DES), 34m, D1 50ost, LCX nl, OM1nl, RCA large, min irregs, EF 35%, EDP 25-30mmHg.  Marland Kitchen DJD (degenerative joint disease), thoracolumbar    Chronic back pain but also neck and knees  . Dysthymia    Well-controlled on Celexa  . Family history of adverse reaction to anesthesia    "takes alot to put my family down" (07/09/2016)  . GERD (gastroesophageal reflux disease)   . Headache    "pretty often" (07/09/2016)  . High cholesterol   . History of kidney stones   . Hypertension   . Hypothyroidism (acquired)    On thyroid replacement  . Impaired fasting glucose    He will A1c 5.7 from August 2017  . Ischemic cardiomyopathy    a. 05/2016 Echo: EF 35-40%.  . LBBB (left bundle branch block)   . OAB (overactive bladder)     Past Surgical History:  Procedure Laterality Date  . CARDIAC CATHETERIZATION N/A 07/09/2016   Procedure: Left Heart Cath and Coronary Angiography;  Surgeon: Leonie Man, MD;  Location: [place] [facility];  Service: Cardiovascular;  Laterality: N/A;  . CARDIAC CATHETERIZATION N/A 07/09/2016   Procedure: Intravascular Pressure Wire/FFR Study;  Surgeon: Leonie Man, MD;  Location: [place] [facility];  Service: Cardiovascular;  Laterality:  N/A;  . CARDIAC CATHETERIZATION N/A 07/09/2016   Procedure: Coronary Stent Intervention;  Surgeon: Leonie Man, MD;  Location: [place] [facility];  Service: Cardiovascular;  Laterality: N/A;  . CATARACT EXTRACTION W/ INTRAOCULAR LENS  IMPLANT, BILATERAL Bilateral   . CORNEAL TRANSPLANT  1970-2013   "3 in right eye; 1 in left eye"  . CORONARY ANGIOPLASTY WITH STENT PLACEMENT  07/09/2016  . EYE SURGERY    . [place]   "cut me 1/2 way open"  . TONSILLECTOMY    . TRANSTHORACIC ECHOCARDIOGRAM  05/17/2016   Normal LV size. Moderate-severely reduced EF of 35-40% with diffuse  HK. Only GR 1 DD noted.  Abdomen normal, dyssynchronous septal motion secondary to LBBB. Moderate TR with PA pressures estimated at 44 mmHg. (Moderate pulmonary hypertension.    Current Medications: Outpatient Medications Prior to Visit  Medication Sig Dispense Refill  . aspirin 81 MG tablet Take 81 mg by mouth daily.    . carvedilol (COREG) 3.125 MG tablet Take 1 tablet (3.125 mg total) by mouth 2 (two) times daily with a meal. 60 tablet 6  . citalopram (CELEXA) 10 MG tablet Take 10 mg by mouth daily.  3  . clopidogrel (PLAVIX) 75 MG tablet Take 1 tablet (75 mg total) by mouth daily with breakfast. 30 tablet 6  . desonide (DESOWEN) 0.05 % cream Apply 1 application topically daily. Applies to ears  3  . furosemide (LASIX) 40 MG tablet Take 1 tablet (40 mg total) by mouth daily. 30 tablet 6  . levothyroxine (SYNTHROID, LEVOTHROID) 50 MCG tablet Take 50 mcg by mouth at bedtime.   4  . loratadine (CLARITIN) 10 MG tablet Take 10 mg by mouth daily.    Marland Kitchen losartan (COZAAR) 50 MG tablet Take 50 mg by mouth daily.  2  . montelukast (SINGULAIR) 10 MG tablet Take 10 mg by mouth daily.  2  . Multiple Vitamin (MULTIVITAMIN WITH MINERALS) TABS tablet Take 1 tablet by mouth daily.    . nitroGLYCERIN (NITROSTAT) 0.4 MG SL tablet Place 1 tablet (0.4 mg total) under the tongue every 5 (five) minutes as needed for chest pain. 25 tablet 3  . pantoprazole (PROTONIX) 40 MG tablet Take 1 tablet (40 mg total) by mouth daily. 30 tablet 6  . Polyethyl Glycol-Propyl Glycol (SYSTANE OP) Place 1 drop into both eyes every 4 (four) hours as needed (dry eyes).    . pravastatin (PRAVACHOL) 80 MG tablet Take 80 mg by mouth at bedtime.  3  . prednisoLONE acetate (PRED FORTE) 1 % ophthalmic suspension Place 1 drop into both eyes daily.  2   No facility-administered medications prior to visit.      Allergies:   Codeine   Social History   Social History  . Marital status: Widowed    Spouse name: N/A  . Number of  children: 1  . Years of education: 30   Occupational History  .      Former Programmer, [employer]   Social History Main Topics  . Smoking status: Never Smoker  . Smokeless tobacco: Never Used  . Alcohol use 1.2 oz/week    1 Standard drinks or equivalent, 1 Shots of liquor per week  . Drug use: No  . Sexual activity: No   Other Topics Concern  . None   Social History Narrative   Widowed mother of one, 2 grandchildren and one great-grandchild.   Recently moved [place] in 2016 to live close to her daughter.  Currently lives alone and is relatively independent of ADLs. She does minimal work around the house.   He previously worked Water quality scientist a Hospital doctor. He did one year of business school after high school.   Does minimal walking maybe 15 minutes a week mostly because of back pain.   She does not smoke. Never smoked. Drinks one at all drink a week.     Family History:  The patient's family history includes AAA (abdominal aortic aneurysm) in her father; Heart attack in her maternal grandfather; Heart disease in her father, mother, and paternal grandmother; Hyperlipidemia in her mother; Hypertension in her mother; Kidney Stones in her father; Stroke in her maternal grandmother.   Review of Systems:   Please see the history of present illness.     General:  No chills, fever, night sweats or weight changes.  Cardiovascular:  No chest pain, dyspnea on exertion, edema, orthopnea, palpitations, paroxysmal nocturnal dyspnea. Dermatological: No rash, lesions/masses Respiratory: No cough, dyspnea Urologic: No hematuria, dysuria Abdominal:   No nausea, vomiting, diarrhea, bright red blood per rectum, melena, or hematemesis Neurologic:  No visual changes, wkns, changes in mental status. Positive for dizziness.  All other systems reviewed and are otherwise negative except as noted above.   Physical Exam:    VS:  BP 116/68   Pulse 88   Ht 4' 11.5" (1.511 m)   Wt 141 lb 6.4 oz (64.1 kg)   SpO2  98%   BMI 28.08 kg/m    General: Pleasant elderly Caucasian female appearing in no acute distress. Head: Normocephalic, atraumatic, sclera non-icteric, no xanthomas, nares are without discharge.  Neck: No carotid bruits. JVD not elevated.  Lungs: Respirations regular and unlabored, without wheezes or rales.  Heart: Regular rate and rhythm. No S3 or S4.  No murmur, no rubs, or gallops appreciated. Abdomen: Soft, non-tender, non-distended with normoactive bowel sounds. No hepatomegaly. No rebound/guarding. No obvious abdominal masses. Msk:  Strength and tone appear normal for age. No joint deformities or effusions. Extremities: No clubbing or cyanosis. No edema.  Distal pedal pulses are 2+ bilaterally. Neuro: Alert and oriented X 3. Moves all extremities spontaneously. No focal deficits noted. Psych:  Responds to questions appropriately with a normal affect. Skin: No rashes or lesions noted  Wt Readings from Last 3 Encounters:  07/15/16 141 lb 6.4 oz (64.1 kg)  07/09/16 140 lb (63.5 kg)  06/24/16 144 lb (65.3 kg)     Studies/Labs Reviewed:   EKG:  EKG is ordered today.  The ekg ordered today demonstrates NSR with PAC's, HR 88, and known LBBB.   Recent Labs: 07/10/2016: BUN 16; Creatinine, Ser 0.99; Hemoglobin 13.4; Platelets 256; Potassium 4.4; Sodium 139   Lipid Panel No results found for: CHOL, TRIG, HDL, CHOLHDL, VLDL, LDLCALC, LDLDIRECT  Additional studies/ records that were reviewed today include:  Echo: 05/17/2016 Study Conclusions  - Left ventricle: The cavity size was normal. There was mild focal   basal hypertrophy of the septum. Systolic function was moderately   reduced. The estimated ejection fraction was in the range of 35%   to 40%. Diffuse hypokinesis. Doppler parameters are consistent   with abnormal left ventricular relaxation (grade 1 diastolic   dysfunction). - Ventricular septum: Septal motion showed abnormal function and   dyssynergy. These changes are  consistent with a left bundle   branch block. - Aortic valve: There was mild regurgitation. - Mitral valve: Calcified annulus. There was mild regurgitation. - Tricuspid valve: There was moderate regurgitation. - Pulmonary  arteries: Systolic pressure was moderately increased.   PA peak pressure: 44 mm Hg (S).  Impressions:  - Septal dyssynergy; global hypokinesis; moderately reduced LV   systolic function; grade 1 diastolic dysfunction; mild AI; mild   MR; moderate TR with moderately elevated pulmonary pressure.  Cardiac Catheterization: 07/09/2016  Prox LAD lesion, 75 %stenosed. FFR 0.79  A STENT SYNERGY DES 2.25X12 drug eluting stent was successfully placed (post-dilated to 2.6 mm). Post intervention, there is a 0% residual stenosis.  Mid LAD lesion, 55 %stenosed.  There is moderate to severe left ventricular systolic dysfunction.  LV end diastolic pressure is severely elevated. ~30 mmHg  Ost 1st Diag lesion, 50 %stenosed.    Sessile FFR guided PCI of the mid LAD. I suspect that this may have contributed some to her symptoms. However the majority of her symptoms are probably still related to her cardiomyopathy. With severely elevated LVEDP, she will need diuretic.  PLAN:  She will need to be monitored overnight given her creatinine clearance, she will receive 6 hours IV fluids. However given elevated LVEDP, will also administer IV Lasix today.  Add Lasix by mouth starting tomorrow  Consider starting carvedilol tomorrow based on what heart rate and blood pressure range overnight3  Aspirin plus Plavix for 1 year   Assessment:    1. CAD S/P percutaneous coronary angioplasty   2. Cardiomyopathy, ischemic   3. Chronic systolic CHF (congestive heart failure) ([place])   4. Left bundle branch block   5. Hyperlipidemia, unspecified hyperlipidemia type   6. Essential hypertension      Plan:   In order of problems listed above:  1. Coronary Artery Disease - cardiac  catheterization on 07/09/2016 showed 75% prox LAD stenosis (FFR of 0.79) with a 2.25 x 12 mm Synergy DES being placed. Also had a 55% stenosis along the mid LAD and 50% stenosis of the Ost 1st Diag.  - EKG today shows her known LBBB with no acute changes. She is tolerating DAPT with ASA and Plavix, denying any evidence of active bleeding. - continue ASA, Plavix, BB, Lasix, ARB, and statin therapy (with potential future adjustment as below pending Lipid Panel).   2. Ischemic Cardiomyopathy/ Chronic Systolic CHF - Echo in 0000000 showed an EF of 35-40% with diffuse HK.  - she does not appear volume overloaded on physical exam. Weight has been stable at 140 lbs on home scales according to the patient. - continue Coreg 3.125mg  BID, Lasix 40mg  daily, and Losartan 50mg  daily. Will not titrate medications further at this time with reported episodes of dizziness (BP well-controlled at today's visit). Have asked the patient to check BP at home. Will obtain BMET to assess kidney function.  - repeat echo in 3 months to reassess EF following recent revascularization.   3. LBBB - noted on EKG. Chronic and unchanged from previous tracing.   4. HLD - no Lipid Panel on file. She is not fasting today. Will come back on Wednesday for fasting labs.  - currently on Pravastatin 80mg  daily. Will switch to high-intensity statin if LDL not at goal.   5. HTN - BP well-controlled at 116/68 during today's visit. I have asked the patient to check her BP at home, as she does report occasional episodes of dizziness. Continue current medications at this time.    Medication Adjustments/Labs and Tests Ordered: Current medicines are reviewed at length with the patient today.  Concerns regarding medicines are outlined above.  Medication changes, Labs and Tests ordered today are listed  in the Patient Instructions below. Patient Instructions  Medication Instructions:  Your physician recommends that you continue on your current  medications as directed. Please refer to the Current Medication list given to you today.  Labwork: Your physician recommends that you return for lab work in: TODAY FASTING-CMET, CBC, LIPIDS  Testing/Procedures: Your physician has requested that you have an echocardiogram. Echocardiography is a painless test that uses sound waves to create images of your heart. It provides your doctor with information about the size and shape of your heart and how well your heart's chambers and valves are working. This procedure takes approximately one hour. There are no restrictions for this procedure.  SCHEDULE ECHO IN 3 MONTHS   Follow-Up: Your physician recommends that you schedule a follow-up appointment in: FOLLOW UP WITH DR [facility] AFTER ECHO  Any Other Special Instructions Will Be Listed Below (If Applicable).  If you need a refill on your cardiac medications before your next appointment, please call your pharmacy.   Arna Medici, [place]  07/15/2016 12:39 PM    [facility] [place], [place], [place]  [address] Phone: [phone]; Fax: [phone]  [address] [place], [place] [address] Phone: [phone]

## 2016-07-15 NOTE — Patient Instructions (Addendum)
Medication Instructions:  Your physician recommends that you continue on your current medications as directed. Please refer to the Current Medication list given to you today.  Labwork: Your physician recommends that you return for lab work in: TODAY FASTING-CMET, CBC, LIPIDS  Testing/Procedures: Your physician has requested that you have an echocardiogram. Echocardiography is a painless test that uses sound waves to create images of your heart. It provides your doctor with information about the size and shape of your heart and how well your heart's chambers and valves are working. This procedure takes approximately one hour. There are no restrictions for this procedure.  SCHEDULE ECHO IN 3 MONTHS   Follow-Up: Your physician recommends that you schedule a follow-up appointment in: FOLLOW UP WITH DR [facility] AFTER ECHO  Any Other Special Instructions Will Be Listed Below (If Applicable).    If you need a refill on your cardiac medications before your next appointment, please call your pharmacy.

## 2016-07-17 DIAGNOSIS — I255 Ischemic cardiomyopathy: Secondary | ICD-10-CM | POA: Diagnosis not present

## 2016-07-17 DIAGNOSIS — Z9861 Coronary angioplasty status: Secondary | ICD-10-CM | POA: Diagnosis not present

## 2016-07-17 DIAGNOSIS — I2510 Atherosclerotic heart disease of native coronary artery without angina pectoris: Secondary | ICD-10-CM | POA: Diagnosis not present

## 2016-07-17 DIAGNOSIS — I447 Left bundle-branch block, unspecified: Secondary | ICD-10-CM | POA: Diagnosis not present

## 2016-07-17 LAB — CBC
HEMATOCRIT: 41.9 % (ref 35.0–45.0)
HEMOGLOBIN: 13.9 g/dL (ref 11.7–15.5)
MCH: 31.7 pg (ref 27.0–33.0)
MCHC: 33.2 g/dL (ref 32.0–36.0)
MCV: 95.4 fL (ref 80.0–100.0)
MPV: 9.1 fL (ref 7.5–12.5)
Platelets: 303 10*3/uL (ref 140–400)
RBC: 4.39 MIL/uL (ref 3.80–5.10)
RDW: 13.5 % (ref 11.0–15.0)
WBC: 6.9 10*3/uL (ref 3.8–10.8)

## 2016-07-17 LAB — LIPID PANEL
CHOLESTEROL: 186 mg/dL (ref <200)
HDL: 48 mg/dL — AB (ref >50)
LDL CALC: 104 mg/dL — AB (ref <100)
TRIGLYCERIDES: 168 mg/dL — AB (ref <150)
Total CHOL/HDL Ratio: 3.9 Ratio (ref <5.0)
VLDL: 34 mg/dL — ABNORMAL HIGH (ref <30)

## 2016-07-17 LAB — COMPREHENSIVE METABOLIC PANEL
ALK PHOS: 48 U/L (ref 33–130)
ALT: 17 U/L (ref 6–29)
AST: 23 U/L (ref 10–35)
Albumin: 4.2 g/dL (ref 3.6–5.1)
BUN: 20 mg/dL (ref 7–25)
CALCIUM: 9.7 mg/dL (ref 8.6–10.4)
CHLORIDE: 99 mmol/L (ref 98–110)
CO2: 31 mmol/L (ref 20–31)
Creat: 1.04 mg/dL — ABNORMAL HIGH (ref 0.60–0.88)
Glucose, Bld: 97 mg/dL (ref 65–99)
POTASSIUM: 4.3 mmol/L (ref 3.5–5.3)
Sodium: 139 mmol/L (ref 135–146)
TOTAL PROTEIN: 7.1 g/dL (ref 6.1–8.1)
Total Bilirubin: 0.5 mg/dL (ref 0.2–1.2)

## 2016-07-18 ENCOUNTER — Telehealth: Payer: Self-pay | Admitting: *Deleted

## 2016-07-18 DIAGNOSIS — I426 Alcoholic cardiomyopathy: Secondary | ICD-10-CM

## 2016-07-18 DIAGNOSIS — I255 Ischemic cardiomyopathy: Secondary | ICD-10-CM

## 2016-07-18 MED ORDER — ATORVASTATIN CALCIUM 40 MG PO TABS: 40.0000 mg | ORAL_TABLET | Freq: Every day | ORAL | 3 refills | Status: DC

## 2016-07-18 NOTE — Telephone Encounter (Signed)
Pt aware of her lab results. She will d/c pravastatin and start atorvastatin 40 mg qd She will repeat lab work on o/v with Dr. Ellyn Hack 10-22-16 at the [facility] office. Pt agreeable with this plan and verbalized understanding.

## 2016-07-18 NOTE — Telephone Encounter (Signed)
-----   Message from Erma Heritage, [place] sent at 07/17/2016  4:43 PM EST ----- Please let the patient know her labs show a stable platelet count, Hgb, electrolytes and creatinine. Her cholesterol remains elevated with an LDL of 104 (goal <70 with known CAD and recent stent placement). Let's stop her Pravastatin and start Atorvastatin 40mg  daily. She will need a repeat Lipid Panel and LFT's in 3 months. Thanks!

## 2016-07-22 DIAGNOSIS — F39 Unspecified mood [affective] disorder: Secondary | ICD-10-CM | POA: Diagnosis not present

## 2016-07-22 DIAGNOSIS — Z6821 Body mass index (BMI) 21.0-21.9, adult: Secondary | ICD-10-CM | POA: Diagnosis not present

## 2016-07-22 DIAGNOSIS — I10 Essential (primary) hypertension: Secondary | ICD-10-CM | POA: Diagnosis not present

## 2016-07-22 DIAGNOSIS — I447 Left bundle-branch block, unspecified: Secondary | ICD-10-CM | POA: Diagnosis not present

## 2016-07-22 DIAGNOSIS — E039 Hypothyroidism, unspecified: Secondary | ICD-10-CM | POA: Diagnosis not present

## 2016-07-22 DIAGNOSIS — I2510 Atherosclerotic heart disease of native coronary artery without angina pectoris: Secondary | ICD-10-CM | POA: Diagnosis not present

## 2016-07-22 DIAGNOSIS — E038 Other specified hypothyroidism: Secondary | ICD-10-CM | POA: Diagnosis not present

## 2016-07-22 DIAGNOSIS — R7301 Impaired fasting glucose: Secondary | ICD-10-CM | POA: Diagnosis not present

## 2016-07-22 DIAGNOSIS — E785 Hyperlipidemia, unspecified: Secondary | ICD-10-CM | POA: Diagnosis not present

## 2016-07-22 DIAGNOSIS — K219 Gastro-esophageal reflux disease without esophagitis: Secondary | ICD-10-CM | POA: Diagnosis not present

## 2016-08-23 ENCOUNTER — Emergency Department (HOSPITAL_COMMUNITY)
Admission: EM | Admit: 2016-08-23 | Discharge: 2016-08-23 | Disposition: A | Discharge status: Home / Self Care | Payer: Medicare Other | Attending: Emergency Medicine | Admitting: Emergency Medicine

## 2016-08-23 ENCOUNTER — Encounter (HOSPITAL_COMMUNITY): Payer: Self-pay | Admitting: Emergency Medicine

## 2016-08-23 ENCOUNTER — Emergency Department (HOSPITAL_COMMUNITY): Payer: Medicare Other

## 2016-08-23 DIAGNOSIS — I2510 Atherosclerotic heart disease of native coronary artery without angina pectoris: Secondary | ICD-10-CM | POA: Insufficient documentation

## 2016-08-23 DIAGNOSIS — Z79899 Other long term (current) drug therapy: Secondary | ICD-10-CM | POA: Insufficient documentation

## 2016-08-23 DIAGNOSIS — E039 Hypothyroidism, unspecified: Secondary | ICD-10-CM | POA: Diagnosis not present

## 2016-08-23 DIAGNOSIS — M542 Cervicalgia: Secondary | ICD-10-CM | POA: Insufficient documentation

## 2016-08-23 DIAGNOSIS — R51 Headache: Secondary | ICD-10-CM | POA: Insufficient documentation

## 2016-08-23 DIAGNOSIS — I110 Hypertensive heart disease with heart failure: Secondary | ICD-10-CM | POA: Insufficient documentation

## 2016-08-23 DIAGNOSIS — Z955 Presence of coronary angioplasty implant and graft: Secondary | ICD-10-CM | POA: Insufficient documentation

## 2016-08-23 DIAGNOSIS — I5023 Acute on chronic systolic (congestive) heart failure: Secondary | ICD-10-CM | POA: Insufficient documentation

## 2016-08-23 DIAGNOSIS — Z7982 Long term (current) use of aspirin: Secondary | ICD-10-CM | POA: Diagnosis not present

## 2016-08-23 DIAGNOSIS — R519 Headache, unspecified: Secondary | ICD-10-CM

## 2016-08-23 LAB — BASIC METABOLIC PANEL
Anion gap: 6 (ref 5–15)
BUN: 15 mg/dL (ref 6–20)
CHLORIDE: 103 mmol/L (ref 101–111)
CO2: 29 mmol/L (ref 22–32)
CREATININE: 1.13 mg/dL — AB (ref 0.44–1.00)
Calcium: 9.4 mg/dL (ref 8.9–10.3)
GFR calc Af Amer: 51 mL/min — ABNORMAL LOW (ref >60)
GFR calc non Af Amer: 44 mL/min — ABNORMAL LOW (ref >60)
Glucose, Bld: 159 mg/dL — ABNORMAL HIGH (ref 65–99)
Potassium: 3.5 mmol/L (ref 3.5–5.1)
SODIUM: 138 mmol/L (ref 135–145)

## 2016-08-23 LAB — CBC
HCT: 39.8 % (ref 36.0–46.0)
Hemoglobin: 13.2 g/dL (ref 12.0–15.0)
MCH: 31.4 pg (ref 26.0–34.0)
MCHC: 33.2 g/dL (ref 30.0–36.0)
MCV: 94.8 fL (ref 78.0–100.0)
PLATELETS: 235 10*3/uL (ref 150–400)
RBC: 4.2 MIL/uL (ref 3.87–5.11)
RDW: 13.2 % (ref 11.5–15.5)
WBC: 6.8 10*3/uL (ref 4.0–10.5)

## 2016-08-23 MED ORDER — CYCLOBENZAPRINE HCL 10 MG PO TABS
5.0000 mg | ORAL_TABLET | Freq: Once | ORAL | Status: AC
  Administered 2016-08-23: 5 mg via ORAL
  Filled 2016-08-23: qty 1

## 2016-08-23 MED ORDER — ACETAMINOPHEN 325 MG PO TABS
325.0000 mg | ORAL_TABLET | Freq: Once | ORAL | Status: AC
  Administered 2016-08-23: 325 mg via ORAL
  Filled 2016-08-23: qty 1

## 2016-08-23 MED ORDER — IOPAMIDOL (ISOVUE-370) INJECTION 76%
INTRAVENOUS | Status: AC
Start: 2016-08-23 — End: 2016-08-23
  Administered 2016-08-23: 50 mL
  Filled 2016-08-23: qty 50

## 2016-08-23 MED ORDER — CYCLOBENZAPRINE HCL 10 MG PO TABS: 10.0000 mg | ORAL_TABLET | Freq: Three times a day (TID) | ORAL | 0 refills | Status: DC | PRN

## 2016-08-23 NOTE — ED Notes (Signed)
Pt to CT

## 2016-08-23 NOTE — ED Provider Notes (Signed)
[facility] DEPT Provider Note   CSN: [id number] Arrival date & time: 08/23/16  1057     History   Chief Complaint Chief Complaint  Patient presents with  . Neck Pain  . Headache    HPI Leah Trujillo is a 82 y.o. female with recent history of 2 stents placed last month, CHF, and headache presents with left lateral neck pain and behind left ear that radiates up with left-sided headache 3 days. Patient states that her pain gradually worsened and yesterday was a 10/10 throbbing, sharp pain, worsening with movement of her head. She took Aleve which moderately alleviated her symptoms. Patient then awoke today with the pain again and took 2 Tylenols which also moderately relieved pain. She describes her pain as 5/10 at this time she still throbbing and sharp, and worsens with movement of her head. Patient admits to having nausea today after taking Tylenol. Patient denies any changes in vision, neurological deficits, changes in gait, vomiting, fevers, chills, posterior neck pain, neck stiffness, trauma, altered mental status, numbness, weakness.  HPI  Past Medical History:  Diagnosis Date  . Chronic radicular pain of lower back   . Chronic systolic CHF (congestive heart failure) ([place])    a. 05/2016 Echo: EF 35-40%, diff HK, Gr1 DD.  Marland Kitchen Complication of anesthesia    "takes alot to put me down" (07/09/2016)  . Coronary artery disease    a. 07/2016 Cath: LM nl, LAD 75p (FFR 0.79 -> 2.25 x 12 Synergy DES), 42m, D1 50ost, LCX nl, OM1nl, RCA large, min irregs, EF 35%, EDP 25-2mmHg.  Marland Kitchen DJD (degenerative joint disease), thoracolumbar    Chronic back pain but also neck and knees  . Dysthymia    Well-controlled on Celexa  . Family history of adverse reaction to anesthesia    "takes alot to put my family down" (07/09/2016)  . GERD (gastroesophageal reflux disease)   . Headache    "pretty often" (07/09/2016)  . High cholesterol   . History of kidney stones   . Hypertension   .  Hypothyroidism (acquired)    On thyroid replacement  . Impaired fasting glucose    He will A1c 5.7 from August 2017  . Ischemic cardiomyopathy    a. 05/2016 Echo: EF 35-40%.  . LBBB (left bundle branch block)   . OAB (overactive bladder)     Patient Active Problem List   Diagnosis Date Noted  . Acute on chronic systolic CHF (congestive heart failure) ([place]) 07/10/2016  . CAD S/P percutaneous coronary angioplasty 07/09/2016  . Cardiomyopathy, ischemic 06/25/2016  . Left bundle branch block 06/24/2016  . Dyspnea 06/24/2016  . Angina, class III ([place]) - with dyspnea as the main symptom 06/24/2016    Past Surgical History:  Procedure Laterality Date  . CARDIAC CATHETERIZATION N/A 07/09/2016   Procedure: Left Heart Cath and Coronary Angiography;  Surgeon: Leonie Man, MD;  Location: [facility];  Service: Cardiovascular;  Laterality: N/A;  . CARDIAC CATHETERIZATION N/A 07/09/2016   Procedure: Intravascular Pressure Wire/FFR Study;  Surgeon: Leonie Man, MD;  Location: [facility];  Service: Cardiovascular;  Laterality: N/A;  . CARDIAC CATHETERIZATION N/A 07/09/2016   Procedure: Coronary Stent Intervention;  Surgeon: Leonie Man, MD;  Location: [facility];  Service: Cardiovascular;  Laterality: N/A;  . CATARACT EXTRACTION W/ INTRAOCULAR LENS  IMPLANT, BILATERAL Bilateral   . CORNEAL TRANSPLANT  1970-2013   "3 in right eye; 1 in left eye"  . CORONARY ANGIOPLASTY  WITH STENT PLACEMENT  07/09/2016  . EYE SURGERY    . [place]   "cut me 1/2 way open"  . TONSILLECTOMY    . TRANSTHORACIC ECHOCARDIOGRAM  05/17/2016   Normal LV size. Moderate-severely reduced EF of 35-40% with diffuse HK. Only GR 1 DD noted.  Abdomen normal, dyssynchronous septal motion secondary to LBBB. Moderate TR with PA pressures estimated at 44 mmHg. (Moderate pulmonary hypertension.    OB History    No data available       Home Medications    Prior to Admission  medications   Medication Sig Start Date End Date Taking? Authorizing Provider  acetaminophen (TYLENOL) 325 MG tablet Take 650 mg by mouth every 6 (six) hours as needed.   Yes Historical Provider, MD  aspirin 81 MG tablet Take 81 mg by mouth daily.   Yes Historical Provider, MD  atorvastatin (LIPITOR) 40 MG tablet Take 1 tablet (40 mg total) by mouth daily. 07/18/16 10/16/16 Yes Erma Heritage, PA  carvedilol (COREG) 3.125 MG tablet Take 1 tablet (3.125 mg total) by mouth 2 (two) times daily with a meal. 07/10/16  Yes Rogelia Mire, NP  citalopram (CELEXA) 10 MG tablet Take 10 mg by mouth daily. 03/13/16  Yes Historical Provider, MD  clopidogrel (PLAVIX) 75 MG tablet Take 1 tablet (75 mg total) by mouth daily with breakfast. 07/10/16  Yes Rogelia Mire, NP  desonide (DESOWEN) 0.05 % cream Apply 1 application topically daily. Applies to ears 05/13/16  Yes Historical Provider, MD  diclofenac (VOLTAREN) 75 MG EC tablet Take 75 mg by mouth 2 (two) times daily.   Yes Historical Provider, MD  furosemide (LASIX) 40 MG tablet Take 1 tablet (40 mg total) by mouth daily. 07/10/16  Yes Rogelia Mire, NP  levothyroxine (SYNTHROID, LEVOTHROID) 50 MCG tablet Take 50 mcg by mouth at bedtime.  04/01/16  Yes Historical Provider, MD  loratadine (CLARITIN) 10 MG tablet Take 10 mg by mouth daily.   Yes Historical Provider, MD  losartan (COZAAR) 50 MG tablet Take 50 mg by mouth daily. 05/07/16  Yes Historical Provider, MD  montelukast (SINGULAIR) 10 MG tablet Take 10 mg by mouth daily. 05/07/16  Yes Historical Provider, MD  Multiple Vitamin (MULTIVITAMIN WITH MINERALS) TABS tablet Take 1 tablet by mouth daily.   Yes Historical Provider, MD  naproxen sodium (ANAPROX) 220 MG tablet Take 220 mg by mouth 2 (two) times daily with a meal.   Yes Historical Provider, MD  nitroGLYCERIN (NITROSTAT) 0.4 MG SL tablet Place 1 tablet (0.4 mg total) under the tongue every 5 (five) minutes as needed for chest pain. 07/10/16   Yes Rogelia Mire, NP  pantoprazole (PROTONIX) 40 MG tablet Take 1 tablet (40 mg total) by mouth daily. 07/10/16  Yes Rogelia Mire, NP  Polyethyl Glycol-Propyl Glycol (SYSTANE OP) Place 1 drop into both eyes every 4 (four) hours as needed (dry eyes).   Yes Historical Provider, MD  pravastatin (PRAVACHOL) 80 MG tablet Take 80 mg by mouth daily. 06/14/16  Yes Historical Provider, MD  prednisoLONE acetate (PRED FORTE) 1 % ophthalmic suspension Place 1 drop into both eyes daily. 03/15/16  Yes Historical Provider, MD  cyclobenzaprine (FLEXERIL) 10 MG tablet Take 1 tablet (10 mg total) by mouth 3 (three) times daily as needed for muscle spasms. 08/23/16   Francisco Mathews [place], [place]    Family History Family History  Problem Relation Age of Onset  . Hypertension Mother   .  Hyperlipidemia Mother   . Heart disease Mother     Pacemaker  . Heart disease Father     Valve disorder  . Kidney Stones Father   . AAA (abdominal aortic aneurysm) Father   . Stroke Maternal Grandmother   . Heart attack Maternal Grandfather   . Heart disease Paternal Grandmother     Social History Social History  Substance Use Topics  . Smoking status: Never Smoker  . Smokeless tobacco: Never Used  . Alcohol use 1.2 oz/week    1 Standard drinks or equivalent, 1 Shots of liquor per week     Allergies   Codeine   Review of Systems Review of Systems  Constitutional: Negative for appetite change, chills and fever.  HENT: Negative for ear pain and sore throat.   Eyes: Negative for pain and visual disturbance.  Respiratory: Negative for cough and shortness of breath.   Cardiovascular: Negative for chest pain and palpitations.  Gastrointestinal: Positive for nausea. Negative for abdominal pain and vomiting.  Genitourinary: Negative for difficulty urinating, dysuria and hematuria.  Musculoskeletal: Negative for arthralgias and back pain.  Skin: Negative for color change and rash.  Neurological: Positive  for dizziness and headaches. Negative for syncope and numbness.  All other systems reviewed and are negative.    Physical Exam Updated Vital Signs BP 126/67 Comment: Simultaneous filing. User may not have seen previous data.  Pulse 70 Comment: Simultaneous filing. User may not have seen previous data.  Temp 97.6 F (36.4 C) (Oral)   Resp 21 Comment: Simultaneous filing. User may not have seen previous data.  SpO2 97% Comment: Simultaneous filing. User may not have seen previous data.  Physical Exam  Constitutional: She is oriented to person, place, and time. She appears well-developed and well-nourished.  HENT:  Head: Normocephalic and atraumatic.  Nose: Nose normal.  Mouth/Throat: Oropharynx is clear and moist. No oropharyngeal exudate.  No tenderness on palpation to temporal area bilaterally.  Eyes: Conjunctivae and EOM are normal. Pupils are equal, round, and reactive to light.  Neck: Normal range of motion. Neck supple.  Cardiovascular: Normal rate, normal heart sounds, intact distal pulses and normal pulses.   No carotid bruits heard on auscultation.  Pulmonary/Chest: Effort normal and breath sounds normal. No respiratory distress. She exhibits no tenderness.  Abdominal: Soft. Bowel sounds are normal. There is no tenderness. There is no rebound and no guarding.  Musculoskeletal: Normal range of motion.  Neurological: She is alert and oriented to person, place, and time. No sensory deficit. Coordination normal.  Cranial Nerves:  III,IV, VI: ptosis not present, extra-ocular movements intact bilaterally, direct and consensual pupillary light reflexes intact bilaterally V: facial sensation, jaw opening, and bite strength equal bilaterally VII: eyebrow raise, eyelid close, smile, frown, pucker equal bilaterally VIII: hearing grossly normal bilaterally  IX,X: palate elevation and swallowing intact XI: bilateral shoulder shrug and lateral head rotation equal and strong XII: midline  tongue extension  Skin: Skin is warm. Capillary refill takes less than 2 seconds.  Psychiatric: She has a normal mood and affect. Her behavior is normal.  Nursing note and vitals reviewed.    ED Treatments / Results  Labs (all labs ordered are listed, but only abnormal results are displayed) Labs Reviewed  BASIC METABOLIC PANEL - Abnormal; Notable for the following:       Result Value   Glucose, Bld 159 (*)    Creatinine, Ser 1.13 (*)    GFR calc non Af Amer 44 (*)  GFR calc Af Amer 51 (*)    All other components within normal limits  CBC    EKG  EKG Interpretation None       Radiology Ct Angio Head W Or Wo Contrast  Result Date: 08/23/2016 CLINICAL DATA:  LEFT-sided posterior neck pain and headache for 2 days. History of hypertension. History of high cholesterol. History of coronary artery disease with ischemic cardiomyopathy. EXAM: CT ANGIOGRAPHY HEAD AND NECK TECHNIQUE: Multidetector CT imaging of the head and neck was performed using the standard protocol during bolus administration of intravenous contrast. Multiplanar CT image reconstructions and MIPs were obtained to evaluate the vascular anatomy. Carotid stenosis measurements (when applicable) are obtained utilizing NASCET criteria, using the distal internal carotid diameter as the denominator. CONTRAST:  Isovue 370, 50 mL. COMPARISON:  None. FINDINGS: CT HEAD Calvarium and skull base: No fracture or destructive lesion. Mastoids and middle ears are grossly clear. Paranasal sinuses: Imaged portions are clear. Orbits: Negative. Brain: No evidence of acute abnormality, including acute infarct, hemorrhage, hydrocephalus, or mass lesion. Moderate atrophy, not unexpected for age. Hypoattenuation of white matter suggesting chronic microvascular ischemic change. CTA NECK Aortic arch: Standard branching. Imaged portion shows no evidence of aneurysm or dissection. No significant stenosis of the major arch vessel origins. Right carotid  system: Minor soft plaque at the RIGHT ICA origin. No ulceration. No evidence of dissection, stenosis (50% or greater) or occlusion. Left carotid system: No significant atheromatous change. No ulceration. No evidence of dissection, stenosis (50% or greater) or occlusion. Vertebral arteries: RIGHT vertebral slightly larger, both patent. No ostial disease. No evidence of dissection, stenosis (50% or greater) or occlusion. Nonvascular soft tissues: Patchy ground-glass opacities at the upper lung zones, without consolidation or pneumothorax. The appearance is nonspecific. Correlate with chest x-ray. No neck masses. Unremarkable thyroid. Spondylosis. CTA HEAD Anterior circulation: Mild calcific plaque in the carotid siphons bilaterally. No significant luminal narrowing. No A1 or M1 stenosis. No MCA branch occlusion. No aneurysm, or vascular malformation. Posterior circulation: Minor atheromatous change in the distal vertebral arteries. Both contribute to formation of the basilar. No significant stenosis, proximal occlusion, aneurysm, or vascular malformation. Venous sinuses: As permitted by contrast timing, patent. Anatomic variants: None of significance. Delayed phase:   No abnormal intracranial enhancement. Review of the MIP images confirms the above findings IMPRESSION: No extracranial or intracranial stenosis of significance. No features suggestive of vasculitis, or craniocerebral vascular dissection. Mild atrophy and small vessel disease. No acute intracranial findings. Electronically Signed   By: Staci Righter M.D.   On: 08/23/2016 13:54   Ct Angio Neck W And/or Wo Contrast  Result Date: 08/23/2016 CLINICAL DATA:  LEFT-sided posterior neck pain and headache for 2 days. History of hypertension. History of high cholesterol. History of coronary artery disease with ischemic cardiomyopathy. EXAM: CT ANGIOGRAPHY HEAD AND NECK TECHNIQUE: Multidetector CT imaging of the head and neck was performed using the standard  protocol during bolus administration of intravenous contrast. Multiplanar CT image reconstructions and MIPs were obtained to evaluate the vascular anatomy. Carotid stenosis measurements (when applicable) are obtained utilizing NASCET criteria, using the distal internal carotid diameter as the denominator. CONTRAST:  Isovue 370, 50 mL. COMPARISON:  None. FINDINGS: CT HEAD Calvarium and skull base: No fracture or destructive lesion. Mastoids and middle ears are grossly clear. Paranasal sinuses: Imaged portions are clear. Orbits: Negative. Brain: No evidence of acute abnormality, including acute infarct, hemorrhage, hydrocephalus, or mass lesion. Moderate atrophy, not unexpected for age. Hypoattenuation of white matter suggesting chronic  microvascular ischemic change. CTA NECK Aortic arch: Standard branching. Imaged portion shows no evidence of aneurysm or dissection. No significant stenosis of the major arch vessel origins. Right carotid system: Minor soft plaque at the RIGHT ICA origin. No ulceration. No evidence of dissection, stenosis (50% or greater) or occlusion. Left carotid system: No significant atheromatous change. No ulceration. No evidence of dissection, stenosis (50% or greater) or occlusion. Vertebral arteries: RIGHT vertebral slightly larger, both patent. No ostial disease. No evidence of dissection, stenosis (50% or greater) or occlusion. Nonvascular soft tissues: Patchy ground-glass opacities at the upper lung zones, without consolidation or pneumothorax. The appearance is nonspecific. Correlate with chest x-ray. No neck masses. Unremarkable thyroid. Spondylosis. CTA HEAD Anterior circulation: Mild calcific plaque in the carotid siphons bilaterally. No significant luminal narrowing. No A1 or M1 stenosis. No MCA branch occlusion. No aneurysm, or vascular malformation. Posterior circulation: Minor atheromatous change in the distal vertebral arteries. Both contribute to formation of the basilar. No  significant stenosis, proximal occlusion, aneurysm, or vascular malformation. Venous sinuses: As permitted by contrast timing, patent. Anatomic variants: None of significance. Delayed phase:   No abnormal intracranial enhancement. Review of the MIP images confirms the above findings IMPRESSION: No extracranial or intracranial stenosis of significance. No features suggestive of vasculitis, or craniocerebral vascular dissection. Mild atrophy and small vessel disease. No acute intracranial findings. Electronically Signed   By: Staci Righter M.D.   On: 08/23/2016 13:54    Procedures Procedures (including critical care time)  Medications Ordered in ED Medications  acetaminophen (TYLENOL) tablet 325 mg (325 mg Oral Given 08/23/16 1218)  iopamidol (ISOVUE-370) 76 % injection (50 mLs  Contrast Given 08/23/16 1316)  cyclobenzaprine (FLEXERIL) tablet 5 mg (5 mg Oral Given 08/23/16 1453)     Initial Impression / Assessment and Plan / ED Course  I have reviewed the triage vital signs and the nursing notes.  Pertinent labs & imaging results that were available during my care of the patient were reviewed by me and considered in my medical decision making (see chart for details).  Clinical Course   Patient is an 82 year old female presenting with left lateral neck pain radiates up behind her left ear and causes her to have a left-sided headache x 3 days. On exam patient is pleasant, in no apparent distress, vital signs stable, and afebrile. Heart and lung sounds are clear. No carotid bruits auscultated. No tenderness to palpation on the left temporal area, left neck or behind her left ear. Abdomen soft/nontender. Neuro exam is normal. Sensory intact. Coordination intact. Strength 5/5 bilaterally. Patient able to ambulate without difficulty. Lab work similar previous findings. CTA of head and neck negative for evidence of dissection, mass, or stenosis. Patient given pain medication and muscle relaxer here in the  Ed. Upon reassessment, Patient felt better. Patient understood and agreed with assessment and plan. Patient is afebrile, vital signs stable, and in no apparent distress prior to discharge. Patient encouraged to follow up with primary care physician and cardiologist tomorrow. Return precautions given for any new or worsening symptoms, Such as fevers, chills, neck stiffness, vision changes, or neurological changes. Patient will be sent home with muscle relaxers and encouraged to take pain medication as needed.  Final Clinical Impressions(s) / ED Diagnoses   Final diagnoses:  Neck pain  Nonintractable headache, unspecified chronicity pattern, unspecified headache type    New Prescriptions Discharge Medication List as of 08/23/2016  2:41 PM    START taking these medications   Details  cyclobenzaprine (  FLEXERIL) 10 MG tablet Take 1 tablet (10 mg total) by mouth 3 (three) times daily as needed for muscle spasms., Starting Fri 08/23/2016, [place], [place] 08/23/16 2156    Jola Schmidt, MD 08/23/16 2207

## 2016-08-23 NOTE — Discharge Instructions (Signed)
Please use warm compress to area for 15 minutes 3 times a day. Please use one tab of Flexeril up to 3 times a day as needed for muscle spasm. Take Tylenol as needed for pain.  Get help right away if: Your headache becomes severe. You have repeated vomiting. You have a stiff neck. You have a loss of vision. You have problems with speech. You have pain in the eye or ear. You have muscular weakness or loss of muscle control. You lose your balance or have trouble walking. You feel faint or pass out. You have confusion.

## 2016-08-23 NOTE — ED Triage Notes (Signed)
Pt states "Im hurting up on my left artery in my neck, I dont know if its a clogged artery or what, but for the last three days i've had pain right here, and it makes my head hurt like a headache." Pt took two aspirin today, states she took aleve yesterday and it helped with the pain.

## 2016-08-23 NOTE — ED Notes (Signed)
Discharged vitals done pt getting dressed.

## 2016-10-15 ENCOUNTER — Ambulatory Visit (HOSPITAL_COMMUNITY): Payer: Medicare Other | Attending: Cardiology

## 2016-10-15 ENCOUNTER — Other Ambulatory Visit: Payer: Self-pay

## 2016-10-15 DIAGNOSIS — I255 Ischemic cardiomyopathy: Secondary | ICD-10-CM

## 2016-10-15 DIAGNOSIS — I351 Nonrheumatic aortic (valve) insufficiency: Secondary | ICD-10-CM | POA: Diagnosis not present

## 2016-10-15 DIAGNOSIS — I340 Nonrheumatic mitral (valve) insufficiency: Secondary | ICD-10-CM | POA: Diagnosis not present

## 2016-10-15 DIAGNOSIS — I501 Left ventricular failure: Secondary | ICD-10-CM | POA: Insufficient documentation

## 2016-10-15 DIAGNOSIS — R9439 Abnormal result of other cardiovascular function study: Secondary | ICD-10-CM | POA: Diagnosis not present

## 2016-10-15 DIAGNOSIS — I510 Cardiac septal defect, acquired: Secondary | ICD-10-CM | POA: Diagnosis not present

## 2016-10-15 HISTORY — PX: TRANSTHORACIC ECHOCARDIOGRAM: SHX275

## 2016-10-22 ENCOUNTER — Encounter: Payer: Self-pay | Admitting: Cardiology

## 2016-10-22 ENCOUNTER — Ambulatory Visit (INDEPENDENT_AMBULATORY_CARE_PROVIDER_SITE_OTHER): Payer: Medicare Other | Admitting: Cardiology

## 2016-10-22 VITALS — BP 118/72 | HR 50 | Ht 60.0 in | Wt 141.1 lb

## 2016-10-22 DIAGNOSIS — I209 Angina pectoris, unspecified: Secondary | ICD-10-CM

## 2016-10-22 DIAGNOSIS — I2510 Atherosclerotic heart disease of native coronary artery without angina pectoris: Secondary | ICD-10-CM | POA: Diagnosis not present

## 2016-10-22 DIAGNOSIS — E785 Hyperlipidemia, unspecified: Secondary | ICD-10-CM | POA: Diagnosis not present

## 2016-10-22 DIAGNOSIS — I255 Ischemic cardiomyopathy: Secondary | ICD-10-CM

## 2016-10-22 DIAGNOSIS — Z9861 Coronary angioplasty status: Secondary | ICD-10-CM | POA: Diagnosis not present

## 2016-10-22 DIAGNOSIS — I5042 Chronic combined systolic (congestive) and diastolic (congestive) heart failure: Secondary | ICD-10-CM | POA: Diagnosis not present

## 2016-10-22 NOTE — Progress Notes (Signed)
PCP: Tivis Ringer, MD  Clinic Note: Chief Complaint  Patient presents with  . Follow-up    folowup from ECHO--no chest pain  . Cardiomyopathy    Combined ischemic and nonischemic  . Coronary Artery Disease    PCI to LAD    HPI: EMILYMARIE DEILY is a 83 y.o. female with a PMH below who presents today for Post-cath follow-up  NECHY DOMINQUEZ was Initially seen for consultation back on 06/24/2016 in referral from her PCP with complaints of worsening exertional dyspnea. Although she has significant arthritis, she did indicate that her activities are more limited by dyspnea and then arthritis pains. Simply walking across the parking deck meter short of breath. EKG was noted to have left bundle branch block with frequent PACs.  She had chest x-ray that showed pulmonary edema and an echocardiogram showed moderate-severely reduced of 35-40% with diffuse hypokinesis. Also noted moderate pulmonary hypertension.. Based on the significance of her echocardiogram findings, when I saw her I felt like course of action would be to proceed with cardiac catheterization.  Catheterization revealed significant proximal LAD lesion that was FFR positive. This was treated with a DES stent, and she was also noted to have severely elevated LVEDP indicative of combined systolic and diastolic heart failure.  She saw [place] back in November post cath and was doing very well. No chest pain or in her dyspnea on exertion and was notably improved. She was maintained stable weights. She did have occasional positional dizziness. A follow-up echocardiogram was ordered to reassess after medical management and PCI  Recent Hospitalizations: None since her cath  Studies Reviewed -- PSH updated  Cardiac Cath: 07/09/2016  Prox LAD lesion, 75 %stenosed. FFR 0.79  A STENT SYNERGY DES 2.25X12 drug eluting stent was successfully placed (post-dilated to 2.6 mm). Post intervention, there is a 0% residual  stenosis.  Mid LAD lesion, 55 %stenosed.  There is moderate to severe left ventricular systolic dysfunction.  LV end diastolic pressure is severely elevated. ~30 mmHg  Ost 1st Diag lesion, 50 %stenosed.    Elevated LVEDP, will also administer IV Lasix today.  Add Lasix by mouth starting tomorrow  Consider starting carvedilol  Aspirin plus Plavix for 1 year   Post-Intervention Diagram       2-D Echo 10/15/2016: EF 45-50%. Still has some mild mid to apical anteroseptal hypokinesis. GR 1 DD. Mild septal dyssynergy related to the. Mild MR, mild TR. Mild LAE and mildly elevated pulmonary pressures.   Interval History: Gianelly presents today Really feeling better. The level of dyspnea has significantly improved, but she still gets short of breath walking up a hill, but nothing on [place]. Previously she was unable to vacuum or do other chores around the house, but she is able to do that now without dyspnea. She's not having any chest tightness or pressure with rest or exertion. Denies any palpitations or irregular heartbeats. Now her activities are more limited by back pain and other arthritis pains. She denies any PND, orthopnea or edema --no longer noticing wheezing with lying down.. She also denies any rapid or irregular heartbeat/palpitations. Nothing to suggest any arrhythmias. She has never had any symptoms of chest tightness or pressure with rest or exertion.  She occasionally may notice some positional dizziness, but no syncope or near syncope, TIA/amaurosis fugax.  No melena, hematochezia, hematuria, or epstaxis. No claudication.  ROS: A comprehensive was performed. Review of Systems  Constitutional: Negative for malaise/fatigue (Just somewhat deconditioned due to lack of  activity.).  HENT: Positive for hearing loss (Cannot hear very well out of left ear). Negative for congestion and nosebleeds.   Eyes:       Can only see light out of left eye. Has had 3 retinal transplants  on right eye.  Respiratory: Positive for shortness of breath (With more significant exertion). Negative for cough and wheezing.   Gastrointestinal: Positive for heartburn (Takes Prilosec 2 times a day that controls relatively well.). Negative for blood in stool, constipation and melena.  Genitourinary: Negative for dysuria, frequency (She does have nocturia that wakes her up in the middle of night.) and hematuria.  Musculoskeletal: Positive for back pain, joint pain (Bilateral knee) and neck pain. Negative for myalgias.  Neurological: Positive for dizziness (Some mild positional). Negative for focal weakness, loss of consciousness and headaches.  Endo/Heme/Allergies: Negative for environmental allergies. Does not bruise/bleed easily.  Psychiatric/Behavioral: Negative for memory loss. The patient is not nervous/anxious and does not have insomnia.     Past Medical History:  Diagnosis Date  . CAD S/P percutaneous coronary angioplasty 07/09/2016   a. Cath: LM nl, pLAD 75% (FFR 0.79 -> 2.25 x 12 Synergy DES), mLAD 55%, ost D1 50%, LCX nl, OM1nl, RCA large, min irregs, EF 35%, EDP 25-32mmHg.  Marland Kitchen Chronic radicular pain of lower back   . Chronic systolic CHF (congestive heart failure) ([place])    a. 05/2016 Echo: EF 35-40%, diff HK, Gr1 DD.  Marland Kitchen Complication of anesthesia    "takes alot to put me down" (07/09/2016)  . DJD (degenerative joint disease), thoracolumbar    Chronic back pain but also neck and knees  . Dysthymia    Well-controlled on Celexa  . Family history of adverse reaction to anesthesia    "takes alot to put my family down" (07/09/2016)  . GERD (gastroesophageal reflux disease)   . Headache    "pretty often" (07/09/2016)  . High cholesterol   . History of kidney stones   . Hypertension   . Hypothyroidism (acquired)    On thyroid replacement  . Impaired fasting glucose    He will A1c 5.7 from August 2017  . Ischemic cardiomyopathy; With some non-ischemic component    a. 05/2016 Echo:  EF 35-40%.; PCI of pLAD with DES.  . LBBB (left bundle branch block)   . OAB (overactive bladder)     Past Surgical History:  Procedure Laterality Date  . CARDIAC CATHETERIZATION N/A 07/09/2016   Procedure: Left Heart Cath and Coronary Angiography;  Surgeon: Leonie Man, MD;  Location: [facility];  Service: Cardiovascular: LM nl, pLAD 75% (FFR 0.79 -> 2.25 x 12 Synergy DES), mLAD 55%, ost D1 50%, LCX nl, OM1nl, RCA large, min irregs, EF 35%, EDP 25-40mmHg.  Marland Kitchen CARDIAC CATHETERIZATION N/A 07/09/2016   Procedure: Intravascular Pressure Wire/FFR Study;  Surgeon: Leonie Man, MD;  Location: [facility];  Service: Cardiovascular: pLAD R: 0.79  . CARDIAC CATHETERIZATION N/A 07/09/2016   Procedure: Coronary Stent Intervention;  Surgeon: Leonie Man, MD;  Location: [facility];  Service: Cardiovascular: pLAD (@D1 ) PCI with SYNERGY DES 2.25 X 12  . CATARACT EXTRACTION W/ INTRAOCULAR LENS  IMPLANT, BILATERAL Bilateral   . CORNEAL TRANSPLANT  1970-2013   "3 in right eye; 1 in left eye"  . CORONARY ANGIOPLASTY WITH STENT PLACEMENT  07/09/2016  . EYE SURGERY    . [place]   "cut me 1/2 way open"  . TONSILLECTOMY    .  TRANSTHORACIC ECHOCARDIOGRAM  05/17/2016   Normal LV size. Moderate-severely reduced EF of 35-40% with diffuse HK. Only GR 1 DD noted.  Abdomen normal, dyssynchronous septal motion secondary to LBBB. Moderate TR with PA pressures estimated at 44 mmHg. (Moderate pulmonary hypertension.     Current Meds  Medication Sig  . acetaminophen (TYLENOL) 325 MG tablet Take 650 mg by mouth every 6 (six) hours as needed.  Marland Kitchen aspirin 81 MG tablet Take 81 mg by mouth daily.  Marland Kitchen atorvastatin (LIPITOR) 40 MG tablet Take 40 mg by mouth daily.  . carvedilol (COREG) 3.125 MG tablet Take 1 tablet (3.125 mg total) by mouth 2 (two) times daily with a meal.  . citalopram (CELEXA) 10 MG tablet Take 10 mg by mouth daily.  . clopidogrel (PLAVIX) 75 MG tablet Take 1  tablet (75 mg total) by mouth daily with breakfast.  . cyclobenzaprine (FLEXERIL) 10 MG tablet Take 1 tablet (10 mg total) by mouth 3 (three) times daily as needed for muscle spasms.  Marland Kitchen desonide (DESOWEN) 0.05 % cream Apply 1 application topically daily. Applies to ears  . diclofenac (VOLTAREN) 75 MG EC tablet Take 75 mg by mouth 2 (two) times daily.  . furosemide (LASIX) 40 MG tablet Take 1 tablet (40 mg total) by mouth daily.  Marland Kitchen levothyroxine (SYNTHROID, LEVOTHROID) 50 MCG tablet Take 50 mcg by mouth at bedtime.   Marland Kitchen loratadine (CLARITIN) 10 MG tablet Take 10 mg by mouth daily.  Marland Kitchen losartan (COZAAR) 50 MG tablet Take 50 mg by mouth daily.  . montelukast (SINGULAIR) 10 MG tablet Take 10 mg by mouth daily.  . Multiple Vitamin (MULTIVITAMIN WITH MINERALS) TABS tablet Take 1 tablet by mouth daily.  . naproxen sodium (ANAPROX) 220 MG tablet Take 220 mg by mouth 2 (two) times daily with a meal.  . nitroGLYCERIN (NITROSTAT) 0.4 MG SL tablet Place 1 tablet (0.4 mg total) under the tongue every 5 (five) minutes as needed for chest pain.  . pantoprazole (PROTONIX) 40 MG tablet Take 1 tablet (40 mg total) by mouth daily.  Vladimir Faster Glycol-Propyl Glycol (SYSTANE OP) Place 1 drop into both eyes every 4 (four) hours as needed (dry eyes).  . pravastatin (PRAVACHOL) 80 MG tablet Take 80 mg by mouth daily.  . prednisoLONE acetate (PRED FORTE) 1 % ophthalmic suspension Place 1 drop into both eyes daily.    Allergies  Allergen Reactions  . Codeine Rash    Social History   Social History  . Marital status: Widowed    Spouse name: N/A  . Number of children: 1  . Years of education: 42   Occupational History  .      Former Programmer, applications   Social History Main Topics  . Smoking status: Never Smoker  . Smokeless tobacco: Never Used  . Alcohol use 1.2 oz/week    1 Standard drinks or equivalent, 1 Shots of liquor per week  . Drug use: No  . Sexual activity: No   Other Topics Concern  . None    Social History Narrative   Widowed mother of one, 2 grandchildren and one great-grandchild.   Recently moved [place] in 2016 to live close to her daughter. Currently lives alone and is relatively independent of ADLs. She does minimal work around the house.   He previously worked Water quality scientist a Hospital doctor. He did one year of business school after high school.   Does minimal walking maybe 15 minutes a week mostly because of back pain.  She does not smoke. Never smoked. Drinks one at all drink a week.    Family History  Problem Relation Age of Onset  . Hypertension Mother   . Hyperlipidemia Mother   . Heart disease Mother     Pacemaker  . Heart disease Father     Valve disorder  . Kidney Stones Father   . AAA (abdominal aortic aneurysm) Father   . Stroke Maternal Grandmother   . Heart attack Maternal Grandfather   . Heart disease Paternal Grandmother     Wt Readings from Last 3 Encounters:  10/22/16 64 kg (141 lb 2 oz)  07/15/16 64.1 kg (141 lb 6.4 oz)  07/09/16 63.5 kg (140 lb)    PHYSICAL EXAM BP 118/72   Pulse (!) 50   Ht 5' (1.524 m)   Wt 64 kg (141 lb 2 oz)   BMI 27.56 kg/m  General appearance: alert, cooperative, appears stated age, no distress and borderline obese.  Well-nourished and well-groomed. HEENT: [place]/AT, EOMI, MMM, anicteric sclera - pupils are round and minimally reactive to light. Right is more reactive than left. Neck: no adenopathy, no carotid bruit and no JVD Lungs: clear to auscultation bilaterally with the exception of faint bibasilar crackles. Normal percussion bilaterally and non-labored Heart: regular rate and rhythm, S1 normal, and split S2 normal, soft 1/6 HSM at LUSB. no click, rub or gallop; nondisplaced PMI Abdomen: soft, non-tender; bowel sounds normal; no masses,  no organomegaly; no HJR Extremities: extremities normal, atraumatic, no cyanosis, and trivial edema  Pulses: 2+ and symmetric;  Skin: normal, mobility and turgor normal, no edema, no  evidence of bleeding or bruising and no lesions noted Neurologic: Mental status: Alert, oriented, thought content appropriate; normal mood and affect. Poor historian. Partly because of poor hearing Cranial nerves: normal (II-XII grossly intact)    Adult ECG Report NA  Other studies Reviewed: Additional studies/ records that were reviewed today include:  Recent Labs:  No labs available currently - lipids followed by PCP   ASSESSMENT / PLAN: Problem List Items Addressed This Visit    Angina, class III ([place]) - with dyspnea as the main symptom (Chronic)    Her significant exertional dyspnea (albeit not classic angina) was significantly improved following PCI of the FFR significant LAD lesion. Proximal LAD lesions are known to be quite debilitating and therefore I believe that PCI of this vessel has helped her.  She is on stable dose of beta blocker which he may be a titrate up next time I see her.      Relevant Medications   atorvastatin (LIPITOR) 40 MG tablet   CAD S/P percutaneous coronary angioplasty (Chronic)    DES stent in the LAD which is not very large in caliber. She has a jailed diagonal branch that had about a 50% stenosis with plaque shifting but not flow-limiting. She is on aspirin plus Plavix.  She is on a beta blocker and ARB along with statin.      Relevant Medications   atorvastatin (LIPITOR) 40 MG tablet   Cardiomyopathy, ischemic - Primary (Chronic)    As expected, I think her cardiomyopathy was probably combined with some component be nonischemic since her EF did not improve fully back to normal. She did have an appreciable improvement in EF following PCI and medical management. Based on the appearance of the echo, it's hard to tell if the wall motion abnormality is related to her bundle branch block or LAD ischemia in the past.  She sees relatively  asymptomatic at baseline with only some exertional dyspnea which is in part at least related to her arthritis and  deconditioning. She is on low-dose carvedilol and moderate dose losartan. With her having some positional dizziness I'm reluctant to titrate these further at this point. She is on a standing dose of Lasix and we discussed daily weights with when necessary additional doses. I also mentioned that if she were to not feel well (for instance having a UTI or other illness) she should potentially hold her losartan plus or minus Lasix.      Relevant Medications   atorvastatin (LIPITOR) 40 MG tablet   Chronic combined systolic and diastolic CHF, NYHA class 2 (HCC) (Chronic)    Symptoms seem to be controlled on current dose of medications. Continue beta blocker, ARB, standing dose of Lasix. She is on a lot of medicines and I would prefer to avoid adding additional medication such as spironolactone      Relevant Medications   atorvastatin (LIPITOR) 40 MG tablet   Dyslipidemia, goal LDL below 70 (Chronic)    Currently on statin (although the med list which show that she is on both pravastatin and atorvastatin. We'll need to clarify this to make sure that she is only on 1. Labs are being monitored by PCP.      Relevant Medications   atorvastatin (LIPITOR) 40 MG tablet      Current medicines are reviewed at length with the patient today. (+/- concerns)  The following changes have been made: No changes at present  Patient Instructions  NO MEDICATION CHANGES AT CURRENT TIME IF YOU FEEL  A LITTLE LIGHTHEADNESS, ILLNESS  MAY HOLD LOSARTAN  FOR 1 TO 2 DAYS.   NO OTHER CHANGES    Your physician recommends that you schedule a follow-up appointment in [place].   If you need a refill on your cardiac medications before your next appointment, please call your pharmacy.    Studies Ordered:   No orders of the defined types were placed in this encounter.     Glenetta Hew, M.D., M.S. Interventional Cardiologist   Pager # [phone] Phone # [phone] [address] [place], [place] [address]

## 2016-10-22 NOTE — Patient Instructions (Addendum)
NO MEDICATION CHANGES AT CURRENT TIME IF YOU FEEL  A LITTLE LIGHTHEADNESS, ILLNESS  MAY HOLD LOSARTAN  FOR 1 TO 2 DAYS.   NO OTHER CHANGES    Your physician recommends that you schedule a follow-up appointment in [place].   If you need a refill on your cardiac medications before your next appointment, please call your pharmacy.

## 2016-10-24 ENCOUNTER — Encounter: Payer: Self-pay | Admitting: Cardiology

## 2016-10-24 DIAGNOSIS — E785 Hyperlipidemia, unspecified: Secondary | ICD-10-CM | POA: Insufficient documentation

## 2016-10-24 NOTE — Assessment & Plan Note (Signed)
Symptoms seem to be controlled on current dose of medications. Continue beta blocker, ARB, standing dose of Lasix. She is on a lot of medicines and I would prefer to avoid adding additional medication such as spironolactone

## 2016-10-24 NOTE — Assessment & Plan Note (Signed)
As expected, I think her cardiomyopathy was probably combined with some component be nonischemic since her EF did not improve fully back to normal. She did have an appreciable improvement in EF following PCI and medical management. Based on the appearance of the echo, it's hard to tell if the wall motion abnormality is related to her bundle branch block or LAD ischemia in the past.  She sees relatively asymptomatic at baseline with only some exertional dyspnea which is in part at least related to her arthritis and deconditioning. She is on low-dose carvedilol and moderate dose losartan. With her having some positional dizziness I'm reluctant to titrate these further at this point. She is on a standing dose of Lasix and we discussed daily weights with when necessary additional doses. I also mentioned that if she were to not feel well (for instance having a UTI or other illness) she should potentially hold her losartan plus or minus Lasix.

## 2016-10-24 NOTE — Assessment & Plan Note (Signed)
Currently on statin (although the med list which show that she is on both pravastatin and atorvastatin. We'll need to clarify this to make sure that she is only on 1. Labs are being monitored by PCP.

## 2016-10-24 NOTE — Assessment & Plan Note (Addendum)
DES stent in the LAD which is not very large in caliber. She has a jailed diagonal branch that had about a 50% stenosis with plaque shifting but not flow-limiting. She is on aspirin plus Plavix.  She is on a beta blocker and ARB along with statin.

## 2016-10-24 NOTE — Assessment & Plan Note (Addendum)
Her significant exertional dyspnea (albeit not classic angina) was significantly improved following PCI of the FFR significant LAD lesion. Proximal LAD lesions are known to be quite debilitating and therefore I believe that PCI of this vessel has helped her.  She is on stable dose of beta blocker which he may be a titrate up next time I see her.

## 2016-11-05 DIAGNOSIS — T85398D Other mechanical complication of other ocular prosthetic devices, implants and grafts, subsequent encounter: Secondary | ICD-10-CM | POA: Diagnosis not present

## 2016-11-05 DIAGNOSIS — H04129 Dry eye syndrome of unspecified lacrimal gland: Secondary | ICD-10-CM | POA: Diagnosis not present

## 2016-11-05 DIAGNOSIS — H52213 Irregular astigmatism, bilateral: Secondary | ICD-10-CM | POA: Diagnosis not present

## 2016-11-06 ENCOUNTER — Telehealth: Payer: Self-pay | Admitting: *Deleted

## 2016-11-06 NOTE — Telephone Encounter (Signed)
-----   Message from Leonie Man, MD sent at 10/24/2016 11:14 AM EST ----- Regarding: Can we find out with statin she is on Ivin Booty, Looked at her med list, she has both atorvastatin and pravastatin listed. I didn't realize that until I was dictating. Can we checked with her or her family to see which one she is actually taking. Preferably would be on pravastatin.  dh

## 2016-11-06 NOTE — Telephone Encounter (Signed)
Returning your call,concerning pt's cholesterol medicine.Marland Kitchen

## 2016-11-06 NOTE — Telephone Encounter (Signed)
Left message to call back--in regards to a question concerning cholesterol medication listed on medication list

## 2016-11-06 NOTE — Telephone Encounter (Signed)
Spoke to daughter - Mickel Baas  laura states she will check patient medication a  contact office.  She states she thinks Dr Dagmar Hait address the issue as well but not sure. RN informed daughter if DR Avva  gave instructions then you may  follow his instructions.

## 2016-11-25 DIAGNOSIS — I2510 Atherosclerotic heart disease of native coronary artery without angina pectoris: Secondary | ICD-10-CM | POA: Diagnosis not present

## 2016-11-25 DIAGNOSIS — F39 Unspecified mood [affective] disorder: Secondary | ICD-10-CM | POA: Diagnosis not present

## 2016-11-25 DIAGNOSIS — E038 Other specified hypothyroidism: Secondary | ICD-10-CM | POA: Diagnosis not present

## 2016-11-25 DIAGNOSIS — I10 Essential (primary) hypertension: Secondary | ICD-10-CM | POA: Diagnosis not present

## 2016-11-25 DIAGNOSIS — E784 Other hyperlipidemia: Secondary | ICD-10-CM | POA: Diagnosis not present

## 2016-11-25 DIAGNOSIS — Z6821 Body mass index (BMI) 21.0-21.9, adult: Secondary | ICD-10-CM | POA: Diagnosis not present

## 2016-11-25 DIAGNOSIS — R7301 Impaired fasting glucose: Secondary | ICD-10-CM | POA: Diagnosis not present

## 2016-11-29 NOTE — Telephone Encounter (Signed)
Patient is taking pravastatin per dr Dagmar Hait notes

## 2016-12-05 DIAGNOSIS — E784 Other hyperlipidemia: Secondary | ICD-10-CM | POA: Diagnosis not present

## 2017-01-20 ENCOUNTER — Other Ambulatory Visit: Payer: Self-pay | Admitting: Nurse Practitioner

## 2017-01-20 NOTE — Telephone Encounter (Signed)
Rx request sent to pharmacy.  

## 2017-01-27 ENCOUNTER — Other Ambulatory Visit: Payer: Self-pay | Admitting: Nurse Practitioner

## 2017-01-28 NOTE — Telephone Encounter (Signed)
Rx(s) sent to pharmacy electronically.  

## 2017-02-24 DIAGNOSIS — E784 Other hyperlipidemia: Secondary | ICD-10-CM | POA: Diagnosis not present

## 2017-02-25 ENCOUNTER — Ambulatory Visit (INDEPENDENT_AMBULATORY_CARE_PROVIDER_SITE_OTHER): Payer: Medicare Other | Admitting: Cardiology

## 2017-02-25 ENCOUNTER — Encounter: Payer: Self-pay | Admitting: Cardiology

## 2017-02-25 VITALS — BP 133/70 | HR 70 | Ht 60.0 in | Wt 140.8 lb

## 2017-02-25 DIAGNOSIS — Z9861 Coronary angioplasty status: Secondary | ICD-10-CM | POA: Diagnosis not present

## 2017-02-25 DIAGNOSIS — I5042 Chronic combined systolic (congestive) and diastolic (congestive) heart failure: Secondary | ICD-10-CM

## 2017-02-25 DIAGNOSIS — I255 Ischemic cardiomyopathy: Secondary | ICD-10-CM

## 2017-02-25 DIAGNOSIS — E785 Hyperlipidemia, unspecified: Secondary | ICD-10-CM | POA: Diagnosis not present

## 2017-02-25 DIAGNOSIS — I209 Angina pectoris, unspecified: Secondary | ICD-10-CM | POA: Diagnosis not present

## 2017-02-25 DIAGNOSIS — I2510 Atherosclerotic heart disease of native coronary artery without angina pectoris: Secondary | ICD-10-CM

## 2017-02-25 NOTE — Patient Instructions (Signed)
NO CHANGE WITH MEDICATIONS  MAY HOLD LASIX IF TRAVELING LONG DISTANCE TAKE WITH YOU IN CASE SWELLING OCCUR.     Your physician wants you to follow-up in [place]. You will receive a reminder letter in the mail two months in advance. If you don't receive a letter, please call our office to schedule the follow-up appointment.

## 2017-02-25 NOTE — Assessment & Plan Note (Signed)
On statin - followed by PCP. Labs just checked - not available. No myalgias.

## 2017-02-25 NOTE — Assessment & Plan Note (Signed)
Notable improvement in her exertional dyspnea. She doesn't have memory of this symptom, but is now more concerned with sciatic pain and dyspnea. She continues to be on stable regimen with beta blocker  and ARB. has not required nitroglycerin

## 2017-02-25 NOTE — Assessment & Plan Note (Signed)
No active heart failure symptoms of PND, orthopnea or edema. Continue current dose of beta blocker, ARB and Lasix. I think we can probably back off on Lasix as time goes on. I've told Leah Trujillo that she can hold doses for dizziness or for trips.

## 2017-02-25 NOTE — Assessment & Plan Note (Signed)
PCI to LAD @ D1 with existing moderate disease downstream - > On ASA & Plavix for now (ok to temporarily hold ASA for bruising / bleeding). Continue BB, ARB & Statin

## 2017-02-25 NOTE — Progress Notes (Signed)
PCP: Prince Solian, MD  Clinic Note: Chief Complaint  Patient presents with  . Follow-up    4 months;  . Coronary Artery Disease    PCI- LAD  . Cardiomyopathy    HPI: Leah Trujillo is a 83 y.o. female with a PMH below who presents today for four-month follow-up for combined ischemic and nonischemic cardiomyopathy with CAD-PCI to LAD.. I initially saw her in consultation on 06/24/2016 with complaints of worsening exertional dyspnea. She was short of breath walking across her neck. She had LBBB on EKG with frequent PACs. Chest x-ray showed pulmonary edema and echocardiogram showed moderate-severe dysfunction with EF 35-40% and diffuse HK. Moderate pulmonary hypertension. --> Cardiac Revealed significant proximal LAD disease (FFR positive) -> DES PCI. Severely elevated LVEDP  Shyne Resch Renier was last seen on 10/22/2016 for post Follow-up.   Recent Hospitalizations: n/a  Studies Personally Reviewed - (if available, images/films reviewed: From Epic Chart or Care Everywhere)  n/a  Interval History: Overall she's doing quite well. She is more bothered by sciatic pain with walking than she is with dyspnea. She still will soften puff with more vigorous activity, and notes feeling a little bit short of breath if she overdoes it. She never did have any chest tightness or pressure, and still does not have any chest tightness or pressure with rest or exertion. No PND, orthopnea or edema.  No palpitations, lightheadedness, dizziness, weakness or syncope/near syncope. No TIA/amaurosis fugax symptoms. No melena, hematochezia, hematuria, or epstaxis. No claudication.  ROS: A comprehensive was performed. Review of Systems  Constitutional: Negative for weight loss (maybe ~1 lb).  HENT: Positive for hearing loss (both ears - not wearing hearing aids). Negative for congestion.   Respiratory: Positive for shortness of breath (Per history of present illness).        She just is getting over  a cold/cough  Gastrointestinal: Positive for heartburn (Relatively controlled with PPI).  Genitourinary: Negative for flank pain and hematuria.  Musculoskeletal: Positive for back pain (With sciatic pain down the leg) and joint pain (Both knees). Negative for myalgias.  Neurological: Positive for dizziness (With rapid position changes). Negative for loss of consciousness.  Endo/Heme/Allergies: Negative for environmental allergies. Does not bruise/bleed easily.  Psychiatric/Behavioral: Negative for memory loss. The patient is not nervous/anxious and does not have insomnia.   All other systems reviewed and are negative.  I have reviewed and (if needed) personally updated the patient's problem list, medications, allergies, past medical and surgical history, social and family history.   Past Medical History:  Diagnosis Date  . CAD S/P percutaneous coronary angioplasty 07/09/2016   a. Cath: LM nl, pLAD 75% (FFR 0.79 -> 2.25 x 12 Synergy DES), mLAD 55%, ost D1 50%, LCX nl, OM1nl, RCA large, min irregs, EF 35%, EDP 25-83mmHg.  Marland Kitchen Chronic radicular pain of lower back   . Chronic systolic CHF (congestive heart failure) ([place])    a. 05/2016 Echo: EF 35-40%, diff HK, Gr1 DD.  Marland Kitchen Complication of anesthesia    "takes alot to put me down" (07/09/2016)  . DJD (degenerative joint disease), thoracolumbar    Chronic back pain but also neck and knees  . Dysthymia    Well-controlled on Celexa  . Family history of adverse reaction to anesthesia    "takes alot to put my family down" (07/09/2016)  . GERD (gastroesophageal reflux disease)   . Headache    "pretty often" (07/09/2016)  . High cholesterol   . History of kidney stones   .  Hypertension   . Hypothyroidism (acquired)    On thyroid replacement  . Impaired fasting glucose    He will A1c 5.7 from August 2017  . Ischemic cardiomyopathy; With some non-ischemic component    a. 05/2016 Echo: EF 35-40%.; PCI of pLAD with DES.  . LBBB (left bundle branch  block)   . OAB (overactive bladder)     Past Surgical History:  Procedure Laterality Date  . CARDIAC CATHETERIZATION N/A 07/09/2016   Procedure: Left Heart Cath and Coronary Angiography;  Surgeon: Leonie Man, MD;  Location: [facility];  Service: Cardiovascular: LM nl, pLAD 75% (FFR 0.79 -> 2.25 x 12 Synergy DES), mLAD 55%, ost D1 50%, LCX nl, OM1nl, RCA large, min irregs, EF 35%, EDP 25-33mmHg.  Marland Kitchen CARDIAC CATHETERIZATION N/A 07/09/2016   Procedure: Intravascular Pressure Wire/FFR Study;  Surgeon: Leonie Man, MD;  Location: [facility];  Service: Cardiovascular: pLAD R: 0.79  . CARDIAC CATHETERIZATION N/A 07/09/2016   Procedure: Coronary Stent Intervention;  Surgeon: Leonie Man, MD;  Location: [facility];  Service: Cardiovascular: pLAD (@D1 ) PCI with SYNERGY DES 2.25 X 12 (post-dialted to 2.6 mm)  . CATARACT EXTRACTION W/ INTRAOCULAR LENS  IMPLANT, BILATERAL Bilateral   . CORNEAL TRANSPLANT  1970-2013   "3 in right eye; 1 in left eye"  . CORONARY ANGIOPLASTY WITH STENT PLACEMENT  07/09/2016  . EYE SURGERY    . [place]   "cut me 1/2 way open"  . TONSILLECTOMY    . TRANSTHORACIC ECHOCARDIOGRAM  05/17/2016   Normal LV size. Moderate-severely reduced EF of 35-40% with diffuse HK. Only GR 1 DD noted.  Abdomen normal, dyssynchronous septal motion secondary to LBBB. Moderate TR with PA pressures estimated at 44 mmHg. (Moderate pulmonary hypertension.  . TRANSTHORACIC ECHOCARDIOGRAM  10/15/2016   Post PCI: EF 45-50%. Still has some mild mid to apical anteroseptal hypokinesis. GR 1 DD. Mild septal dyssynergy related to the. Mild MR, mild TR. Mild LAE and mildly elevated pulmonary pressures.   Post-Intervention Cath Diagram 07/2016 - Synergy DES 2.25 x 12 (2.6 mm) pLAD     Current Meds  Medication Sig  . acetaminophen (TYLENOL) 325 MG tablet Take 650 mg by mouth every 6 (six) hours as needed.  Marland Kitchen aspirin 81 MG tablet Take 81 mg by mouth  daily.  . carvedilol (COREG) 3.125 MG tablet TAKE 1 TABLET BY MOUTH TWICE A DAY WITH A MEAL  . citalopram (CELEXA) 10 MG tablet Take 10 mg by mouth daily.  . clopidogrel (PLAVIX) 75 MG tablet TAKE 1 TABLET BY MOUTH DAILY WITH BREAKFAST  . desonide (DESOWEN) 0.05 % cream Apply 1 application topically daily. Applies to ears  . diclofenac (VOLTAREN) 75 MG EC tablet Take 75 mg by mouth 2 (two) times daily.  . furosemide (LASIX) 40 MG tablet TAKE 1 TABLET BY MOUTH DAILY  . levothyroxine (SYNTHROID, LEVOTHROID) 50 MCG tablet Take 50 mcg by mouth at bedtime.   Marland Kitchen loratadine (CLARITIN) 10 MG tablet Take 10 mg by mouth daily.  Marland Kitchen losartan (COZAAR) 50 MG tablet Take 50 mg by mouth daily.  . Multiple Vitamin (MULTIVITAMIN WITH MINERALS) TABS tablet Take 1 tablet by mouth daily.  . naproxen sodium (ANAPROX) 220 MG tablet Take 220 mg by mouth 2 (two) times daily with a meal.  . nitroGLYCERIN (NITROSTAT) 0.4 MG SL tablet Place 1 tablet (0.4 mg total) under the tongue every 5 (five) minutes as needed for chest pain.  Marland Kitchen  pantoprazole (PROTONIX) 40 MG tablet Take 1 tablet (40 mg total) by mouth daily.  Vladimir Faster Glycol-Propyl Glycol (SYSTANE OP) Place 1 drop into both eyes every 4 (four) hours as needed (dry eyes).  . pravastatin (PRAVACHOL) 80 MG tablet Take 80 mg by mouth daily.  . prednisoLONE acetate (PRED FORTE) 1 % ophthalmic suspension Place 1 drop into both eyes daily.    Allergies  Allergen Reactions  . Codeine Rash    Social History   Social History  . Marital status: Widowed    Spouse name: N/A  . Number of children: 1  . Years of education: 2   Occupational History  .      Former Programmer, [employer]   Social History Main Topics  . Smoking status: Never Smoker  . Smokeless tobacco: Never Used  . Alcohol use 1.2 oz/week    1 Standard drinks or equivalent, 1 Shots of liquor per week  . Drug use: No  . Sexual activity: No   Other Topics Concern  . None   Social History Narrative    Widowed mother of one, 2 grandchildren and one great-grandchild.   Recently moved [place] in 2016 to live close to her daughter. Currently lives alone and is relatively independent of ADLs. She does minimal work around the house.   He previously worked Water quality scientist a Hospital doctor. He did one year of business school after high school.   Does minimal walking maybe 15 minutes a week mostly because of back pain.   She does not smoke. Never smoked. Drinks one at all drink a week.    family history includes AAA (abdominal aortic aneurysm) in her father; Heart attack in her maternal grandfather; Heart disease in her father, mother, and paternal grandmother; Hyperlipidemia in her mother; Hypertension in her mother; Kidney Stones in her father; Stroke in her maternal grandmother.  Wt Readings from Last 3 Encounters:  02/25/17 140 lb 12.8 oz (63.9 kg)  10/22/16 141 lb 2 oz (64 kg)  07/15/16 141 lb 6.4 oz (64.1 kg)    PHYSICAL EXAM BP 133/70   Pulse 70   Ht 5' (1.524 m)   Wt 140 lb 12.8 oz (63.9 kg)   BMI 27.50 kg/m  General appearance: alert, cooperative, appears stated age, no distress. Well-nourished, well-groomed  HEENT: [place]/AT, EOMI, MMM, anicteric sclera; PRRLA (R>L) Neck: no adenopathy, no carotid bruit and no JVD Lungs: clear to auscultation bilaterally - except for fine basal crackles, normal percussion bilaterally and non-labored Heart: regular rate and rhythm, S1 normal with split S2, no click, rub or gallop; soft 1/6 HSM at RUSB. Nondisplaced PMI. Abdomen: soft, non-tender; bowel sounds normal; no masses,  no organomegaly; no HJR Extremities: extremities normal, atraumatic, no cyanosis, and no edema  Pulses: 2+ and symmetric;  Skin: mobility and turgor normal and no evidence of bleeding or bruising  Neurologic: Mental status: Alert & oriented x 3, thought content appropriate; non-focal exam.  Very poor historian. Pleasant mood & affect.    Adult ECG Report  Rate: 70 ;  Rhythm: normal  sinus rhythm and LBBB;   Narrative Interpretation: Stable EKG and-    Other studies Reviewed: Additional studies/ records that were reviewed today include:  Recent Labs:  Recently checked by PCP  Lab Results  Component Value Date   CHOL 186 07/17/2016   HDL 48 (L) 07/17/2016   LDLCALC 104 (H) 07/17/2016   TRIG 168 (H) 07/17/2016   CHOLHDL 3.9 07/17/2016   Lab Results  Component  Value Date   CREATININE 1.13 (H) 08/23/2016   BUN 15 08/23/2016   NA 138 08/23/2016   K 3.5 08/23/2016   CL 103 08/23/2016   CO2 29 08/23/2016     ASSESSMENT / PLAN: Overall stable - progressing well. No changes to Rx.  Problem List Items Addressed This Visit    Angina, class III ([place]) - with dyspnea as the main symptom (Chronic)    Notable improvement in her exertional dyspnea. She doesn't have memory of this symptom, but is now more concerned with sciatic pain and dyspnea. She continues to be on stable regimen with beta blocker  and ARB. has not required nitroglycerin       Relevant Orders   EKG 12-Lead   CAD S/P percutaneous coronary angioplasty - Primary (Chronic)    PCI to LAD @ D1 with existing moderate disease downstream - > On ASA & Plavix for now (ok to temporarily hold ASA for bruising / bleeding). Continue BB, ARB & Statin      Relevant Orders   EKG 12-Lead   Cardiomyopathy, ischemic (Chronic)    Improved EF on post-PCI Echo. Hard to assess b/c poor historian (poor recollection of Sx)      Relevant Orders   EKG 12-Lead   Chronic combined systolic and diastolic CHF, NYHA class 2 ([facility]) (Chronic)    No active heart failure symptoms of PND, orthopnea or edema. Continue current dose of beta blocker, ARB and Lasix. I think we can probably back off on Lasix as time goes on. I've told her that she can hold doses for dizziness or for trips.       Relevant Orders   EKG 12-Lead   Dyslipidemia, goal LDL below 70 (Chronic)    On statin - followed by PCP. Labs just checked - not  available. No myalgias.         Current medicines are reviewed at length with the patient today. (+/- concerns) n/a The following changes have been made: n/a  Patient Instructions   NO CHANGE WITH MEDICATIONS  MAY HOLD LASIX IF TRAVELING LONG DISTANCE TAKE WITH YOU IN CASE SWELLING OCCUR.     Your physician wants you to follow-up in [place]. You will receive a reminder letter in the mail two months in advance. If you don't receive a letter, please call our office to schedule the follow-up appointment.     Studies Ordered:   Orders Placed This Encounter  Procedures  . EKG 12-Lead      Glenetta Hew, M.D., M.S. Interventional Cardiologist   Pager # [phone] Phone # [phone] [address] [place],  [address]

## 2017-02-25 NOTE — Assessment & Plan Note (Signed)
Improved EF on post-PCI Echo. Hard to assess b/c poor historian (poor recollection of Sx)

## 2017-06-04 DIAGNOSIS — I10 Essential (primary) hypertension: Secondary | ICD-10-CM | POA: Diagnosis not present

## 2017-06-04 DIAGNOSIS — E7849 Other hyperlipidemia: Secondary | ICD-10-CM | POA: Diagnosis not present

## 2017-06-04 DIAGNOSIS — R7301 Impaired fasting glucose: Secondary | ICD-10-CM | POA: Diagnosis not present

## 2017-06-04 DIAGNOSIS — Z0000 Encounter for general adult medical examination without abnormal findings: Secondary | ICD-10-CM | POA: Diagnosis not present

## 2017-06-04 DIAGNOSIS — E038 Other specified hypothyroidism: Secondary | ICD-10-CM | POA: Diagnosis not present

## 2017-06-04 DIAGNOSIS — N3281 Overactive bladder: Secondary | ICD-10-CM | POA: Diagnosis not present

## 2017-06-11 DIAGNOSIS — Z0000 Encounter for general adult medical examination without abnormal findings: Secondary | ICD-10-CM | POA: Diagnosis not present

## 2017-06-11 DIAGNOSIS — J3089 Other allergic rhinitis: Secondary | ICD-10-CM | POA: Diagnosis not present

## 2017-06-11 DIAGNOSIS — K219 Gastro-esophageal reflux disease without esophagitis: Secondary | ICD-10-CM | POA: Diagnosis not present

## 2017-06-11 DIAGNOSIS — E038 Other specified hypothyroidism: Secondary | ICD-10-CM | POA: Diagnosis not present

## 2017-06-11 DIAGNOSIS — F39 Unspecified mood [affective] disorder: Secondary | ICD-10-CM | POA: Diagnosis not present

## 2017-06-11 DIAGNOSIS — Z23 Encounter for immunization: Secondary | ICD-10-CM | POA: Diagnosis not present

## 2017-06-11 DIAGNOSIS — R7301 Impaired fasting glucose: Secondary | ICD-10-CM | POA: Diagnosis not present

## 2017-06-11 DIAGNOSIS — Z1389 Encounter for screening for other disorder: Secondary | ICD-10-CM | POA: Diagnosis not present

## 2017-06-11 DIAGNOSIS — E7849 Other hyperlipidemia: Secondary | ICD-10-CM | POA: Diagnosis not present

## 2017-06-11 DIAGNOSIS — N3281 Overactive bladder: Secondary | ICD-10-CM | POA: Diagnosis not present

## 2017-06-11 DIAGNOSIS — I2510 Atherosclerotic heart disease of native coronary artery without angina pectoris: Secondary | ICD-10-CM | POA: Diagnosis not present

## 2017-06-11 DIAGNOSIS — I10 Essential (primary) hypertension: Secondary | ICD-10-CM | POA: Diagnosis not present

## 2017-06-16 DIAGNOSIS — Z1212 Encounter for screening for malignant neoplasm of rectum: Secondary | ICD-10-CM | POA: Diagnosis not present

## 2017-08-19 ENCOUNTER — Ambulatory Visit (INDEPENDENT_AMBULATORY_CARE_PROVIDER_SITE_OTHER): Payer: Medicare Other | Admitting: Cardiology

## 2017-08-19 ENCOUNTER — Encounter: Payer: Self-pay | Admitting: Cardiology

## 2017-08-19 DIAGNOSIS — I255 Ischemic cardiomyopathy: Secondary | ICD-10-CM

## 2017-08-19 DIAGNOSIS — Z9861 Coronary angioplasty status: Secondary | ICD-10-CM | POA: Diagnosis not present

## 2017-08-19 DIAGNOSIS — I5042 Chronic combined systolic (congestive) and diastolic (congestive) heart failure: Secondary | ICD-10-CM | POA: Diagnosis not present

## 2017-08-19 DIAGNOSIS — I209 Angina pectoris, unspecified: Secondary | ICD-10-CM

## 2017-08-19 DIAGNOSIS — E785 Hyperlipidemia, unspecified: Secondary | ICD-10-CM | POA: Diagnosis not present

## 2017-08-19 DIAGNOSIS — I2510 Atherosclerotic heart disease of native coronary artery without angina pectoris: Secondary | ICD-10-CM | POA: Diagnosis not present

## 2017-08-19 NOTE — Patient Instructions (Addendum)
No change with medications  Please have primary send a copy of next set of labs - lipids when they are completed  Your physician wants you to follow-up in 6 month with DR HARDING. You will receive a reminder letter in the mail two months in advance. If you don't receive a letter, please call our office to schedule the follow-up appointment.    If you need a refill on your cardiac medications before your next appointment, please call your pharmacy.

## 2017-08-19 NOTE — Progress Notes (Signed)
PCP: Prince Solian, MD  Clinic Note: Chief Complaint  Patient presents with  . Follow-up    6 months;  . Coronary Artery Disease    PCI    HPI: Leah Trujillo is a 83 y.o. female with a PMH below who presents today for four-month follow-up for combined ischemic and nonischemic cardiomyopathy with CAD-PCI to LAD.. I initially saw her in consultation on 06/24/2016 with complaints of worsening exertional dyspnea. She was short of breath walking across her neck. She had LBBB on EKG with frequent PACs. Chest x-ray showed pulmonary edema and echocardiogram showed moderate-severe dysfunction with EF 35-40% and diffuse HK. Moderate pulmonary hypertension. --> Cardiac Revealed significant proximal LAD disease (FFR positive) -> DES PCI. Severely elevated LVEDP  Leah Trujillo was last seen in June 2018 for post Follow-up. She was more bothered by sciatic pain with walking than she is with dyspnea. She still will huff &puff with more vigorous activity, and notes feeling a little bit short of breath if she overdoes it.  She never did have any chest tightness or pressure, and still does not have any chest tightness or pressure with rest or exertion. No PND, orthopnea or edema.  Recent Hospitalizations: n/a  Studies Personally Reviewed - (if available, images/films reviewed: From Epic Chart or Care Everywhere)  n/a  Interval History: Overall she's doing quite well.  Mostly what she notes is having some sinus pressure and headache.  She takes Tylenol and cold medications which seems to be helping that.  She thinks that maybe her dyspnea was related to her congestion.  She really thinks that her exertional dyspnea and chest discomfort symptoms have totally been alleviated following PCI. She indicates that her PCP changed her statin to pravastatin (her total cholesterol is 171, triglycerides 158 HDL 43 and LDL 96.  I am not exactly sure why this was changed. She says that her exertional dyspnea  is somewhat stabilized.  Really she seems to be more bothered by her sciatic pain and congestion than anything else.  She really does not get much relief from the Tylenol.  Unfortunately with the pain she does not do much in the way of any exercise.  However for what she is able to do, she denies any significant dyspnea unless she overdoes it does probably because the back pain.  She denies any chest tightness or pressure with rest or exertion.  Remainder of cardiac review of symptoms: No PND, orthopnea or edema.  No palpitations, lightheadedness, dizziness, weakness or syncope/near syncope. No TIA/amaurosis fugax symptoms. No claudication.  ROS: A comprehensive was performed. Review of Systems  Constitutional: Negative for weight loss (maybe ~1 lb).  HENT: Positive for hearing loss (both ears - not wearing hearing aids). Negative for congestion.   Respiratory: Positive for shortness of breath (Sometimes related to allergies).        She just is getting over a cold/cough  Gastrointestinal: Positive for heartburn (Relatively controlled with PPI).  Genitourinary: Negative for flank pain and hematuria.  Musculoskeletal: Positive for back pain (With sciatic pain down the leg) and joint pain (Both knees). Negative for myalgias.  Neurological: Positive for dizziness (With rapid position changes and when her back hurts). Negative for loss of consciousness.  Endo/Heme/Allergies: Negative for environmental allergies. Does not bruise/bleed easily.  Psychiatric/Behavioral: Negative for memory loss. The patient is not nervous/anxious and does not have insomnia.   All other systems reviewed and are negative.  I have reviewed and (if needed) personally updated the  patient's problem list, medications, allergies, past medical and surgical history, social and family history.   Past Medical History:  Diagnosis Date  . CAD S/P percutaneous coronary angioplasty 07/09/2016   a. Cath: LM nl, pLAD 75% (FFR 0.79  -> 2.25 x 12 Synergy DES), mLAD 55%, ost D1 50%, LCX nl, OM1nl, RCA large, min irregs, EF 35%, EDP 25-79mmHg.  Marland Kitchen Chronic radicular pain of lower back   . Chronic systolic CHF (congestive heart failure) ([place])    a. 05/2016 Echo: EF 35-40%, diff HK, Gr1 DD.  Marland Kitchen Complication of anesthesia    "takes alot to put me down" (07/09/2016)  . DJD (degenerative joint disease), thoracolumbar    Chronic back pain but also neck and knees  . Dysthymia    Well-controlled on Celexa  . Family history of adverse reaction to anesthesia    "takes alot to put my family down" (07/09/2016)  . GERD (gastroesophageal reflux disease)   . Headache    "pretty often" (07/09/2016)  . High cholesterol   . History of kidney stones   . Hypertension   . Hypothyroidism (acquired)    On thyroid replacement  . Impaired fasting glucose    He will A1c 5.7 from August 2017  . Ischemic cardiomyopathy; With some non-ischemic component    a. 05/2016 Echo: EF 35-40%.; PCI of pLAD with DES.  . LBBB (left bundle branch block)   . OAB (overactive bladder)     Past Surgical History:  Procedure Laterality Date  . CARDIAC CATHETERIZATION N/A 07/09/2016   Procedure: Left Heart Cath and Coronary Angiography;  Surgeon: Leonie Man, MD;  Location: [facility];  Service: Cardiovascular: LM nl, pLAD 75% (FFR 0.79 -> 2.25 x 12 Synergy DES), mLAD 55%, ost D1 50%, LCX nl, OM1nl, RCA large, min irregs, EF 35%, EDP 25-47mmHg.  Marland Kitchen CARDIAC CATHETERIZATION N/A 07/09/2016   Procedure: Intravascular Pressure Wire/FFR Study;  Surgeon: Leonie Man, MD;  Location: [facility];  Service: Cardiovascular: pLAD R: 0.79  . CARDIAC CATHETERIZATION N/A 07/09/2016   Procedure: Coronary Stent Intervention;  Surgeon: Leonie Man, MD;  Location: [facility];  Service: Cardiovascular: pLAD (@D1 ) PCI with SYNERGY DES 2.25 X 12 (post-dialted to 2.6 mm)  . CATARACT EXTRACTION W/ INTRAOCULAR LENS  IMPLANT, BILATERAL Bilateral   . CORNEAL  TRANSPLANT  1970-2013   "3 in right eye; 1 in left eye"  . CORONARY ANGIOPLASTY WITH STENT PLACEMENT  07/09/2016  . EYE SURGERY    . [place]   "cut me 1/2 way open"  . TONSILLECTOMY    . TRANSTHORACIC ECHOCARDIOGRAM  05/17/2016   Normal LV size. Moderate-severely reduced EF of 35-40% with diffuse HK. Only GR 1 DD noted.  Abdomen normal, dyssynchronous septal motion secondary to LBBB. Moderate TR with PA pressures estimated at 44 mmHg. (Moderate pulmonary hypertension.  . TRANSTHORACIC ECHOCARDIOGRAM  10/15/2016   Post PCI: EF 45-50%. Still has some mild mid to apical anteroseptal hypokinesis. GR 1 DD. Mild septal dyssynergy related to the. Mild MR, mild TR. Mild LAE and mildly elevated pulmonary pressures.   Post-Intervention Cath Diagram 07/2016 - Synergy DES 2.25 x 12 (2.6 mm) pLAD     Current Meds  Medication Sig  . acetaminophen (TYLENOL) 325 MG tablet Take 650 mg by mouth every 6 (six) hours as needed.  Marland Kitchen aspirin 81 MG tablet Take 81 mg by mouth daily.  . citalopram (CELEXA) 10 MG tablet Take 10 mg by  mouth daily.  Marland Kitchen desonide (DESOWEN) 0.05 % cream Apply 1 application topically daily. Applies to ears  . diclofenac (VOLTAREN) 75 MG EC tablet Take 75 mg by mouth 2 (two) times daily.  Marland Kitchen levothyroxine (SYNTHROID, LEVOTHROID) 50 MCG tablet Take 50 mcg by mouth at bedtime.   Marland Kitchen loratadine (CLARITIN) 10 MG tablet Take 10 mg by mouth daily.  Marland Kitchen losartan (COZAAR) 50 MG tablet Take 50 mg by mouth daily.  . Multiple Vitamin (MULTIVITAMIN WITH MINERALS) TABS tablet Take 1 tablet by mouth daily.  . naproxen sodium (ANAPROX) 220 MG tablet Take 220 mg by mouth 2 (two) times daily with a meal.  . nitroGLYCERIN (NITROSTAT) 0.4 MG SL tablet Place 1 tablet (0.4 mg total) under the tongue every 5 (five) minutes as needed for chest pain.  . pantoprazole (PROTONIX) 40 MG tablet Take 1 tablet (40 mg total) by mouth daily.  Vladimir Faster Glycol-Propyl Glycol (SYSTANE OP) Place 1 drop into  both eyes every 4 (four) hours as needed (dry eyes).  . pravastatin (PRAVACHOL) 80 MG tablet Take 80 mg by mouth daily.  . prednisoLONE acetate (PRED FORTE) 1 % ophthalmic suspension Place 1 drop into both eyes daily.  . [DISCONTINUED] carvedilol (COREG) 3.125 MG tablet TAKE 1 TABLET BY MOUTH TWICE A DAY WITH A MEAL  . [DISCONTINUED] clopidogrel (PLAVIX) 75 MG tablet TAKE 1 TABLET BY MOUTH DAILY WITH BREAKFAST  . [DISCONTINUED] furosemide (LASIX) 40 MG tablet TAKE 1 TABLET BY MOUTH DAILY    Allergies  Allergen Reactions  . Codeine Rash    Social History   Socioeconomic History  . Marital status: Widowed    Spouse name: None  . Number of children: 1  . Years of education: 36  . Highest education level: None  Social Needs  . Financial resource strain: None  . Food insecurity - worry: None  . Food insecurity - inability: None  . Transportation needs - medical: None  . Transportation needs - non-medical: None  Occupational History    Comment: Former Programmer, [employer]  Tobacco Use  . Smoking status: Never Smoker  . Smokeless tobacco: Never Used  Substance and Sexual Activity  . Alcohol use: Yes    Alcohol/week: 1.2 oz    Types: 1 Standard drinks or equivalent, 1 Shots of liquor per week  . Drug use: No  . Sexual activity: No  Other Topics Concern  . None  Social History Narrative   Widowed mother of one, 2 grandchildren and one great-grandchild.   Recently moved [place] in 2016 to live close to her daughter. Currently lives alone and is relatively independent of ADLs. She does minimal work around the house.   He previously worked Water quality scientist a Hospital doctor. He did one year of business school after high school.   Does minimal walking maybe 15 minutes a week mostly because of back pain.   She does not smoke. Never smoked. Drinks one at all drink a week.    family history includes AAA (abdominal aortic aneurysm) in her father; Heart attack in her maternal grandfather; Heart disease  in her father, mother, and paternal grandmother; Hyperlipidemia in her mother; Hypertension in her mother; Kidney Stones in her father; Stroke in her maternal grandmother.  Wt Readings from Last 3 Encounters:  08/19/17 140 lb (63.5 kg)  02/25/17 140 lb 12.8 oz (63.9 kg)  10/22/16 141 lb 2 oz (64 kg)    PHYSICAL EXAM BP 126/77   Pulse 91   Ht 5' (1.524 m)  Wt 140 lb (63.5 kg)   BMI 27.34 kg/m   Physical Exam  Constitutional: She is oriented to person, place, and time. She appears well-developed and well-nourished. No distress.  Healthy-appearing.  Well-groomed  HENT:  Head: Normocephalic and atraumatic.  Eyes: EOM are normal.  Neck: Normal range of motion. Neck supple. No hepatojugular reflux and no JVD present. Carotid bruit is not present.  Cardiovascular: Normal rate, regular rhythm, intact distal pulses and normal pulses.  No extrasystoles are present. PMI is not displaced. Exam reveals no gallop and no friction rub.  Murmur heard. High-pitched blowing decrescendo holosystolic murmur is present with a grade of 1/6 at the apex. Pulmonary/Chest: Effort normal and breath sounds normal. No respiratory distress. She has no wheezes. She has no rales.  Mild interstitial sounds, but no crackles or rales.  Abdominal: Soft. Bowel sounds are normal. She exhibits no distension. There is no tenderness. There is no rebound.  Musculoskeletal: Normal range of motion. She exhibits no edema.  Neurological: She is alert and oriented to person, place, and time.  Psychiatric: She has a normal mood and affect. Her behavior is normal. Judgment and thought content normal.  Nursing note and vitals reviewed.    Adult ECG Report Not checked   Other studies Reviewed: Additional studies/ records that were reviewed today include:  Recent Labs:  Recently checked by PCP = See scanned results from 06/2017 Lab Results  Component Value Date   CHOL 186 07/17/2016   HDL 48 (L) 07/17/2016   LDLCALC 104  (H) 07/17/2016   TRIG 168 (H) 07/17/2016   CHOLHDL 3.9 07/17/2016   -Most recent labs showed total cholesterol 171, LDL 96.  TG 158 and HDL 43. -- ?  Statin was changed to pravastatin  Lab Results  Component Value Date   CREATININE 1.13 (H) 08/23/2016   BUN 15 08/23/2016   NA 138 08/23/2016   K 3.5 08/23/2016   CL 103 08/23/2016   CO2 29 08/23/2016     ASSESSMENT / PLAN: Overall stable - progressing well. No changes to Rx.  Problem List Items Addressed This Visit    CAD S/P percutaneous coronary angioplasty (Chronic)    She had PCI to the LAD and at the first diagonal branch with some moderate existing downstream disease.  Thankfully she is not having any further anginal symptoms (her major anginal symptom was exertional dyspnea).  She is now more limited walking by her back pain. Continue aspirin and Plavix for now along with beta-blocker, ARB and statin.  -We can probably eventually stop aspirin, especially if she has some bleeding or bruising.      Cardiomyopathy, ischemic (Chronic)    She did have improvement of her EF following PCI -improved from 35-40% up to 45-50%.  I suspect some of her reduction in EF was related to her bundle branch block as well.  This clearly indicates that the treated lesion was culprit.  Moderate downstream lesion probably seems to be nonsignificant at this time.      Chronic combined systolic and diastolic CHF, NYHA class 2 (HCC) (Chronic)    Interestingly, no active heart failure symptoms.  No real PND orthopnea.  She probably has some exertional dyspnea from this, but is more attributable to her back discomfort.  She is on stable dose of ARB and beta-blocker along with Lasix.  Right now she is doing well and is euvolemic K leaving her on the current dose, however if she feels a little bit dry dizzy, she  can hold the dose. Since she is relatively astigmatic be reluctant to further titrate up her beta-blocker or ARB.      Dyslipidemia, goal LDL  below 70 (Chronic)    Interestingly she was switched to pravastatin which is less potent than when she had been on.  Not sure the reasoning behind it.  Thankfully, her LDL has improved, but not yet at goal.  I suspect if we can try to get her goal would probably need to switch her to Crestor as opposed to going backwards to pravastatin in order to get better control.  Most likely this which was because of potential side effects.  Either Crestor or potentially Livalo would probably be a better option for better control.  The other option would be to add another medicine symptoms that he and I would be reluctant to do so with her multiple medications.         Current medicines are reviewed at length with the patient today. (+/- concerns) n/a The following changes have been made: n/a  Patient Instructions  No change with medications  Please have primary send a copy of next set of labs - lipids when they are completed  Your physician wants you to follow-up in 6 month with DR Marvel Sapp. You will receive a reminder letter in the mail two months in advance. If you don't receive a letter, please call our office to schedule the follow-up appointment.    If you need a refill on your cardiac medications before your next appointment, please call your pharmacy.   Studies Ordered:   No orders of the defined types were placed in this encounter.     Glenetta Hew, M.D., M.S. Interventional Cardiologist   Pager # [phone] Phone # [phone] [address] [place], [place] [id number]

## 2017-08-21 ENCOUNTER — Other Ambulatory Visit: Payer: Self-pay | Admitting: Cardiology

## 2017-08-22 ENCOUNTER — Encounter: Payer: Self-pay | Admitting: Cardiology

## 2017-08-22 NOTE — Assessment & Plan Note (Signed)
Interestingly, no active heart failure symptoms.  No real PND orthopnea.  She probably has some exertional dyspnea from this, but is more attributable to her back discomfort.  She is on stable dose of ARB and beta-blocker along with Lasix.  Right now she is doing well and is euvolemic K leaving her on the current dose, however if she feels a little bit dry dizzy, she can hold the dose. Since she is relatively astigmatic be reluctant to further titrate up her beta-blocker or ARB.

## 2017-08-22 NOTE — Assessment & Plan Note (Signed)
She had PCI to the LAD and at the first diagonal branch with some moderate existing downstream disease.  Thankfully she is not having any further anginal symptoms (her major anginal symptom was exertional dyspnea).  She is now more limited walking by her back pain. Continue aspirin and Plavix for now along with beta-blocker, ARB and statin.  -We can probably eventually stop aspirin, especially if she has some bleeding or bruising.

## 2017-08-22 NOTE — Assessment & Plan Note (Signed)
Interestingly she was switched to pravastatin which is less potent than when she had been on.  Not sure the reasoning behind it.  Thankfully, her LDL has improved, but not yet at goal.  I suspect if we can try to get her goal would probably need to switch her to Crestor as opposed to going backwards to pravastatin in order to get better control.  Most likely this which was because of potential side effects.  Either Crestor or potentially Livalo would probably be a better option for better control.  The other option would be to add another medicine symptoms that he and I would be reluctant to do so with her multiple medications.

## 2017-08-22 NOTE — Assessment & Plan Note (Signed)
She did have improvement of her EF following PCI -improved from 35-40% up to 45-50%.  I suspect some of her reduction in EF was related to her bundle branch block as well.  This clearly indicates that the treated lesion was culprit.  Moderate downstream lesion probably seems to be nonsignificant at this time.

## 2017-09-10 DIAGNOSIS — E7849 Other hyperlipidemia: Secondary | ICD-10-CM | POA: Diagnosis not present

## 2017-09-10 DIAGNOSIS — I10 Essential (primary) hypertension: Secondary | ICD-10-CM | POA: Diagnosis not present

## 2017-09-10 DIAGNOSIS — Z23 Encounter for immunization: Secondary | ICD-10-CM | POA: Diagnosis not present

## 2017-09-10 DIAGNOSIS — Z6821 Body mass index (BMI) 21.0-21.9, adult: Secondary | ICD-10-CM | POA: Diagnosis not present

## 2017-09-10 DIAGNOSIS — J3089 Other allergic rhinitis: Secondary | ICD-10-CM | POA: Diagnosis not present

## 2017-09-10 DIAGNOSIS — E038 Other specified hypothyroidism: Secondary | ICD-10-CM | POA: Diagnosis not present

## 2017-10-20 IMAGING — CT CT ANGIO NECK
1 of 12 series · 1 of 34 positions shown · IV contrast (Iohexol (Omnipaque 350))
Comparison: None.

CLINICAL DATA: LEFT-sided posterior neck pain and headache for 2
days. History of hypertension. History of high cholesterol. History
of coronary artery disease with ischemic cardiomyopathy.

EXAM:
CT ANGIOGRAPHY HEAD AND NECK
TECHNIQUE: Multidetector CT imaging of the head and neck was performed using
the standard protocol during bolus administration of intravenous
contrast. Multiplanar CT image reconstructions and MIPs were
obtained to evaluate the vascular anatomy. Carotid stenosis
measurements (when applicable) are obtained utilizing NASCET
criteria, using the distal internal carotid diameter as the
denominator.
CONTRAST:  Isovue 370, 50 mL.

[Series 300: locator · axial · 0.49mm/px · 1 of 1 slices shown]
[im 1/1  soft-tissue]
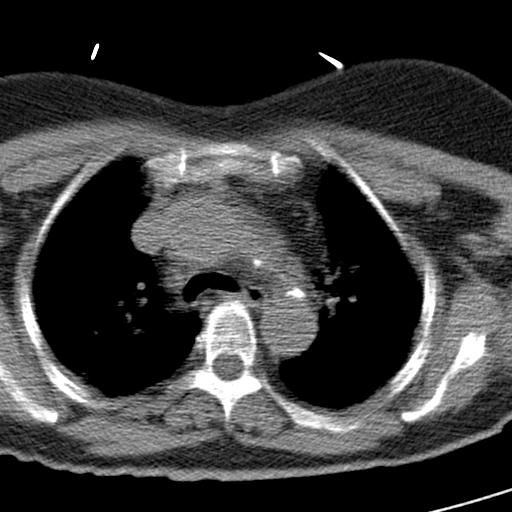

[1 of 34 positions shown; findings below may reference images not displayed]

FINDINGS: CT HEAD

Calvarium and skull base: No fracture or destructive lesion.
Mastoids and middle ears are grossly clear.

Paranasal sinuses: Imaged portions are clear.

Orbits: Negative.

Brain: No evidence of acute abnormality, including acute infarct,
hemorrhage, hydrocephalus, or mass lesion. Moderate atrophy, not
unexpected for age. Hypoattenuation of white matter suggesting
chronic microvascular ischemic change.

CTA NECK

Aortic arch: Standard branching. Imaged portion shows no evidence of
aneurysm or dissection. No significant stenosis of the major arch
vessel origins.

Right carotid system: Minor soft plaque at the RIGHT ICA origin. No
ulceration. No evidence of dissection, stenosis (50% or greater) or
occlusion.

Left carotid system: No significant atheromatous change. No
ulceration. No evidence of dissection, stenosis (50% or greater) or
occlusion.

Vertebral arteries: RIGHT vertebral slightly larger, both patent. No
ostial disease. No evidence of dissection, stenosis (50% or greater)
or occlusion.

Nonvascular soft tissues: Patchy ground-glass opacities at the upper
lung zones, without consolidation or pneumothorax. The appearance is
nonspecific. Correlate with chest x-ray. No neck masses.
Unremarkable thyroid. Spondylosis.

CTA HEAD

Anterior circulation: Mild calcific plaque in the carotid siphons
bilaterally. No significant luminal narrowing. No A1 or M1 stenosis.
No MCA branch occlusion. No aneurysm, or vascular malformation.

Posterior circulation: Minor atheromatous change in the distal
vertebral arteries. Both contribute to formation of the basilar. No
significant stenosis, proximal occlusion, aneurysm, or vascular
malformation.

Venous sinuses: As permitted by contrast timing, patent.

Anatomic variants: None of significance.

Delayed phase:   No abnormal intracranial enhancement.

Review of the MIP images confirms the above findings
IMPRESSION: No extracranial or intracranial stenosis of significance.

No features suggestive of vasculitis, or craniocerebral vascular
dissection.

Mild atrophy and small vessel disease. No acute intracranial
findings.

## 2017-11-10 DIAGNOSIS — T85398D Other mechanical complication of other ocular prosthetic devices, implants and grafts, subsequent encounter: Secondary | ICD-10-CM | POA: Diagnosis not present

## 2017-11-10 DIAGNOSIS — H52212 Irregular astigmatism, left eye: Secondary | ICD-10-CM | POA: Diagnosis not present

## 2017-12-15 DIAGNOSIS — F329 Major depressive disorder, single episode, unspecified: Secondary | ICD-10-CM | POA: Diagnosis not present

## 2017-12-15 DIAGNOSIS — S42211A Unspecified displaced fracture of surgical neck of right humerus, initial encounter for closed fracture: Secondary | ICD-10-CM | POA: Diagnosis not present

## 2017-12-15 DIAGNOSIS — R441 Visual hallucinations: Secondary | ICD-10-CM | POA: Diagnosis not present

## 2017-12-15 DIAGNOSIS — S42301D Unspecified fracture of shaft of humerus, right arm, subsequent encounter for fracture with routine healing: Secondary | ICD-10-CM | POA: Diagnosis not present

## 2017-12-15 DIAGNOSIS — S42294A Other nondisplaced fracture of upper end of right humerus, initial encounter for closed fracture: Secondary | ICD-10-CM | POA: Diagnosis not present

## 2017-12-15 DIAGNOSIS — I129 Hypertensive chronic kidney disease with stage 1 through stage 4 chronic kidney disease, or unspecified chronic kidney disease: Secondary | ICD-10-CM | POA: Diagnosis not present

## 2017-12-15 DIAGNOSIS — G9389 Other specified disorders of brain: Secondary | ICD-10-CM | POA: Diagnosis not present

## 2017-12-15 DIAGNOSIS — M79631 Pain in right forearm: Secondary | ICD-10-CM | POA: Diagnosis not present

## 2017-12-15 DIAGNOSIS — H5462 Unqualified visual loss, left eye, normal vision right eye: Secondary | ICD-10-CM | POA: Diagnosis present

## 2017-12-15 DIAGNOSIS — N183 Chronic kidney disease, stage 3 (moderate): Secondary | ICD-10-CM | POA: Diagnosis not present

## 2017-12-15 DIAGNOSIS — I452 Bifascicular block: Secondary | ICD-10-CM | POA: Diagnosis not present

## 2017-12-15 DIAGNOSIS — R410 Disorientation, unspecified: Secondary | ICD-10-CM | POA: Diagnosis not present

## 2017-12-15 DIAGNOSIS — M80021A Age-related osteoporosis with current pathological fracture, right humerus, initial encounter for fracture: Secondary | ICD-10-CM | POA: Diagnosis not present

## 2017-12-15 DIAGNOSIS — I499 Cardiac arrhythmia, unspecified: Secondary | ICD-10-CM | POA: Diagnosis not present

## 2017-12-15 DIAGNOSIS — I429 Cardiomyopathy, unspecified: Secondary | ICD-10-CM | POA: Diagnosis not present

## 2017-12-15 DIAGNOSIS — I509 Heart failure, unspecified: Secondary | ICD-10-CM | POA: Diagnosis not present

## 2017-12-15 DIAGNOSIS — K219 Gastro-esophageal reflux disease without esophagitis: Secondary | ICD-10-CM | POA: Diagnosis not present

## 2017-12-15 DIAGNOSIS — S32501A Unspecified fracture of right pubis, initial encounter for closed fracture: Secondary | ICD-10-CM | POA: Diagnosis not present

## 2017-12-15 DIAGNOSIS — R7301 Impaired fasting glucose: Secondary | ICD-10-CM | POA: Diagnosis not present

## 2017-12-15 DIAGNOSIS — I447 Left bundle-branch block, unspecified: Secondary | ICD-10-CM | POA: Diagnosis present

## 2017-12-15 DIAGNOSIS — I255 Ischemic cardiomyopathy: Secondary | ICD-10-CM | POA: Diagnosis not present

## 2017-12-15 DIAGNOSIS — E7800 Pure hypercholesterolemia, unspecified: Secondary | ICD-10-CM | POA: Diagnosis not present

## 2017-12-15 DIAGNOSIS — H9190 Unspecified hearing loss, unspecified ear: Secondary | ICD-10-CM | POA: Diagnosis present

## 2017-12-15 DIAGNOSIS — I130 Hypertensive heart and chronic kidney disease with heart failure and stage 1 through stage 4 chronic kidney disease, or unspecified chronic kidney disease: Secondary | ICD-10-CM | POA: Diagnosis not present

## 2017-12-15 DIAGNOSIS — S32591D Other specified fracture of right pubis, subsequent encounter for fracture with routine healing: Secondary | ICD-10-CM | POA: Diagnosis not present

## 2017-12-15 DIAGNOSIS — Z7401 Bed confinement status: Secondary | ICD-10-CM | POA: Diagnosis not present

## 2017-12-15 DIAGNOSIS — R9431 Abnormal electrocardiogram [ECG] [EKG]: Secondary | ICD-10-CM | POA: Diagnosis not present

## 2017-12-15 DIAGNOSIS — E785 Hyperlipidemia, unspecified: Secondary | ICD-10-CM | POA: Diagnosis present

## 2017-12-15 DIAGNOSIS — E039 Hypothyroidism, unspecified: Secondary | ICD-10-CM | POA: Diagnosis not present

## 2017-12-15 DIAGNOSIS — R451 Restlessness and agitation: Secondary | ICD-10-CM | POA: Diagnosis not present

## 2017-12-15 DIAGNOSIS — R531 Weakness: Secondary | ICD-10-CM | POA: Diagnosis not present

## 2017-12-15 DIAGNOSIS — I083 Combined rheumatic disorders of mitral, aortic and tricuspid valves: Secondary | ICD-10-CM | POA: Diagnosis not present

## 2017-12-15 DIAGNOSIS — E1122 Type 2 diabetes mellitus with diabetic chronic kidney disease: Secondary | ICD-10-CM | POA: Diagnosis present

## 2017-12-15 DIAGNOSIS — I5042 Chronic combined systolic (congestive) and diastolic (congestive) heart failure: Secondary | ICD-10-CM | POA: Diagnosis not present

## 2017-12-15 DIAGNOSIS — K589 Irritable bowel syndrome without diarrhea: Secondary | ICD-10-CM | POA: Diagnosis not present

## 2017-12-15 DIAGNOSIS — S32591A Other specified fracture of right pubis, initial encounter for closed fracture: Secondary | ICD-10-CM | POA: Diagnosis not present

## 2017-12-15 DIAGNOSIS — R339 Retention of urine, unspecified: Secondary | ICD-10-CM | POA: Diagnosis not present

## 2017-12-15 DIAGNOSIS — M25559 Pain in unspecified hip: Secondary | ICD-10-CM | POA: Diagnosis not present

## 2017-12-15 DIAGNOSIS — M80851A Other osteoporosis with current pathological fracture, right femur, initial encounter for fracture: Secondary | ICD-10-CM | POA: Diagnosis not present

## 2017-12-15 DIAGNOSIS — Z01811 Encounter for preprocedural respiratory examination: Secondary | ICD-10-CM | POA: Diagnosis not present

## 2017-12-15 DIAGNOSIS — K5900 Constipation, unspecified: Secondary | ICD-10-CM | POA: Diagnosis not present

## 2017-12-15 DIAGNOSIS — S42213A Unspecified displaced fracture of surgical neck of unspecified humerus, initial encounter for closed fracture: Secondary | ICD-10-CM | POA: Diagnosis not present

## 2017-12-15 DIAGNOSIS — W19XXXA Unspecified fall, initial encounter: Secondary | ICD-10-CM | POA: Diagnosis not present

## 2017-12-15 DIAGNOSIS — I10 Essential (primary) hypertension: Secondary | ICD-10-CM | POA: Diagnosis not present

## 2017-12-15 DIAGNOSIS — S42201A Unspecified fracture of upper end of right humerus, initial encounter for closed fracture: Secondary | ICD-10-CM | POA: Diagnosis not present

## 2017-12-15 DIAGNOSIS — S32511A Fracture of superior rim of right pubis, initial encounter for closed fracture: Secondary | ICD-10-CM | POA: Diagnosis not present

## 2017-12-15 DIAGNOSIS — K581 Irritable bowel syndrome with constipation: Secondary | ICD-10-CM | POA: Diagnosis present

## 2017-12-15 DIAGNOSIS — S60511A Abrasion of right hand, initial encounter: Secondary | ICD-10-CM | POA: Diagnosis not present

## 2017-12-15 DIAGNOSIS — I2510 Atherosclerotic heart disease of native coronary artery without angina pectoris: Secondary | ICD-10-CM | POA: Diagnosis not present

## 2017-12-15 DIAGNOSIS — R400 Somnolence: Secondary | ICD-10-CM | POA: Diagnosis not present

## 2017-12-15 DIAGNOSIS — E079 Disorder of thyroid, unspecified: Secondary | ICD-10-CM | POA: Diagnosis not present

## 2017-12-15 DIAGNOSIS — S32301A Unspecified fracture of right ilium, initial encounter for closed fracture: Secondary | ICD-10-CM | POA: Diagnosis not present

## 2017-12-15 DIAGNOSIS — M25551 Pain in right hip: Secondary | ICD-10-CM | POA: Diagnosis not present

## 2017-12-15 DIAGNOSIS — M80051A Age-related osteoporosis with current pathological fracture, right femur, initial encounter for fracture: Secondary | ICD-10-CM | POA: Diagnosis not present

## 2017-12-22 DIAGNOSIS — I129 Hypertensive chronic kidney disease with stage 1 through stage 4 chronic kidney disease, or unspecified chronic kidney disease: Secondary | ICD-10-CM | POA: Diagnosis not present

## 2017-12-22 DIAGNOSIS — S32301A Unspecified fracture of right ilium, initial encounter for closed fracture: Secondary | ICD-10-CM | POA: Diagnosis not present

## 2017-12-22 DIAGNOSIS — S32810D Multiple fractures of pelvis with stable disruption of pelvic ring, subsequent encounter for fracture with routine healing: Secondary | ICD-10-CM | POA: Diagnosis not present

## 2017-12-22 DIAGNOSIS — R52 Pain, unspecified: Secondary | ICD-10-CM | POA: Diagnosis not present

## 2017-12-22 DIAGNOSIS — R339 Retention of urine, unspecified: Secondary | ICD-10-CM | POA: Diagnosis not present

## 2017-12-22 DIAGNOSIS — I2510 Atherosclerotic heart disease of native coronary artery without angina pectoris: Secondary | ICD-10-CM | POA: Diagnosis not present

## 2017-12-22 DIAGNOSIS — S32591D Other specified fracture of right pubis, subsequent encounter for fracture with routine healing: Secondary | ICD-10-CM | POA: Diagnosis not present

## 2017-12-22 DIAGNOSIS — S42301D Unspecified fracture of shaft of humerus, right arm, subsequent encounter for fracture with routine healing: Secondary | ICD-10-CM | POA: Diagnosis not present

## 2017-12-22 DIAGNOSIS — N183 Chronic kidney disease, stage 3 (moderate): Secondary | ICD-10-CM | POA: Diagnosis not present

## 2017-12-22 DIAGNOSIS — Z9181 History of falling: Secondary | ICD-10-CM | POA: Diagnosis not present

## 2017-12-22 DIAGNOSIS — I499 Cardiac arrhythmia, unspecified: Secondary | ICD-10-CM | POA: Diagnosis not present

## 2017-12-22 DIAGNOSIS — E785 Hyperlipidemia, unspecified: Secondary | ICD-10-CM | POA: Diagnosis not present

## 2017-12-22 DIAGNOSIS — E079 Disorder of thyroid, unspecified: Secondary | ICD-10-CM | POA: Diagnosis not present

## 2017-12-22 DIAGNOSIS — Z7401 Bed confinement status: Secondary | ICD-10-CM | POA: Diagnosis not present

## 2017-12-22 DIAGNOSIS — S42201D Unspecified fracture of upper end of right humerus, subsequent encounter for fracture with routine healing: Secondary | ICD-10-CM | POA: Diagnosis not present

## 2017-12-22 DIAGNOSIS — K5900 Constipation, unspecified: Secondary | ICD-10-CM | POA: Diagnosis not present

## 2017-12-22 DIAGNOSIS — R531 Weakness: Secondary | ICD-10-CM | POA: Diagnosis not present

## 2017-12-22 DIAGNOSIS — I10 Essential (primary) hypertension: Secondary | ICD-10-CM | POA: Diagnosis not present

## 2017-12-22 DIAGNOSIS — F329 Major depressive disorder, single episode, unspecified: Secondary | ICD-10-CM | POA: Diagnosis not present

## 2017-12-22 DIAGNOSIS — I429 Cardiomyopathy, unspecified: Secondary | ICD-10-CM | POA: Diagnosis not present

## 2017-12-22 DIAGNOSIS — R410 Disorientation, unspecified: Secondary | ICD-10-CM | POA: Diagnosis not present

## 2017-12-22 DIAGNOSIS — K219 Gastro-esophageal reflux disease without esophagitis: Secondary | ICD-10-CM | POA: Diagnosis not present

## 2017-12-24 DIAGNOSIS — Z9181 History of falling: Secondary | ICD-10-CM | POA: Diagnosis not present

## 2017-12-24 DIAGNOSIS — S32810D Multiple fractures of pelvis with stable disruption of pelvic ring, subsequent encounter for fracture with routine healing: Secondary | ICD-10-CM | POA: Diagnosis not present

## 2017-12-24 DIAGNOSIS — S42301D Unspecified fracture of shaft of humerus, right arm, subsequent encounter for fracture with routine healing: Secondary | ICD-10-CM | POA: Diagnosis not present

## 2017-12-30 DIAGNOSIS — S42201D Unspecified fracture of upper end of right humerus, subsequent encounter for fracture with routine healing: Secondary | ICD-10-CM | POA: Diagnosis not present

## 2017-12-30 DIAGNOSIS — R52 Pain, unspecified: Secondary | ICD-10-CM | POA: Diagnosis not present

## 2017-12-30 DIAGNOSIS — I10 Essential (primary) hypertension: Secondary | ICD-10-CM | POA: Diagnosis not present

## 2017-12-30 DIAGNOSIS — S32810D Multiple fractures of pelvis with stable disruption of pelvic ring, subsequent encounter for fracture with routine healing: Secondary | ICD-10-CM | POA: Diagnosis not present

## 2018-01-12 DIAGNOSIS — F329 Major depressive disorder, single episode, unspecified: Secondary | ICD-10-CM | POA: Diagnosis not present

## 2018-01-12 DIAGNOSIS — S42294D Other nondisplaced fracture of upper end of right humerus, subsequent encounter for fracture with routine healing: Secondary | ICD-10-CM | POA: Diagnosis not present

## 2018-01-12 DIAGNOSIS — I130 Hypertensive heart and chronic kidney disease with heart failure and stage 1 through stage 4 chronic kidney disease, or unspecified chronic kidney disease: Secondary | ICD-10-CM | POA: Diagnosis not present

## 2018-01-12 DIAGNOSIS — Z7902 Long term (current) use of antithrombotics/antiplatelets: Secondary | ICD-10-CM | POA: Diagnosis not present

## 2018-01-12 DIAGNOSIS — E1122 Type 2 diabetes mellitus with diabetic chronic kidney disease: Secondary | ICD-10-CM | POA: Diagnosis not present

## 2018-01-12 DIAGNOSIS — K219 Gastro-esophageal reflux disease without esophagitis: Secondary | ICD-10-CM | POA: Diagnosis not present

## 2018-01-12 DIAGNOSIS — S329XXD Fracture of unspecified parts of lumbosacral spine and pelvis, subsequent encounter for fracture with routine healing: Secondary | ICD-10-CM | POA: Diagnosis not present

## 2018-01-12 DIAGNOSIS — N183 Chronic kidney disease, stage 3 (moderate): Secondary | ICD-10-CM | POA: Diagnosis not present

## 2018-01-12 DIAGNOSIS — W19XXXD Unspecified fall, subsequent encounter: Secondary | ICD-10-CM | POA: Diagnosis not present

## 2018-01-12 DIAGNOSIS — S4291XD Fracture of right shoulder girdle, part unspecified, subsequent encounter for fracture with routine healing: Secondary | ICD-10-CM | POA: Diagnosis not present

## 2018-01-12 DIAGNOSIS — I509 Heart failure, unspecified: Secondary | ICD-10-CM | POA: Diagnosis not present

## 2018-01-12 DIAGNOSIS — F3289 Other specified depressive episodes: Secondary | ICD-10-CM | POA: Diagnosis not present

## 2018-01-12 DIAGNOSIS — Z9181 History of falling: Secondary | ICD-10-CM | POA: Diagnosis not present

## 2018-01-13 DIAGNOSIS — S42294D Other nondisplaced fracture of upper end of right humerus, subsequent encounter for fracture with routine healing: Secondary | ICD-10-CM | POA: Diagnosis not present

## 2018-01-13 DIAGNOSIS — I130 Hypertensive heart and chronic kidney disease with heart failure and stage 1 through stage 4 chronic kidney disease, or unspecified chronic kidney disease: Secondary | ICD-10-CM | POA: Diagnosis not present

## 2018-01-13 DIAGNOSIS — S4291XD Fracture of right shoulder girdle, part unspecified, subsequent encounter for fracture with routine healing: Secondary | ICD-10-CM | POA: Diagnosis not present

## 2018-01-13 DIAGNOSIS — W19XXXD Unspecified fall, subsequent encounter: Secondary | ICD-10-CM | POA: Diagnosis not present

## 2018-01-13 DIAGNOSIS — S329XXD Fracture of unspecified parts of lumbosacral spine and pelvis, subsequent encounter for fracture with routine healing: Secondary | ICD-10-CM | POA: Diagnosis not present

## 2018-01-13 DIAGNOSIS — F329 Major depressive disorder, single episode, unspecified: Secondary | ICD-10-CM | POA: Diagnosis not present

## 2018-01-14 DIAGNOSIS — S42294D Other nondisplaced fracture of upper end of right humerus, subsequent encounter for fracture with routine healing: Secondary | ICD-10-CM | POA: Diagnosis not present

## 2018-01-14 DIAGNOSIS — W19XXXD Unspecified fall, subsequent encounter: Secondary | ICD-10-CM | POA: Diagnosis not present

## 2018-01-14 DIAGNOSIS — S4291XD Fracture of right shoulder girdle, part unspecified, subsequent encounter for fracture with routine healing: Secondary | ICD-10-CM | POA: Diagnosis not present

## 2018-01-14 DIAGNOSIS — I130 Hypertensive heart and chronic kidney disease with heart failure and stage 1 through stage 4 chronic kidney disease, or unspecified chronic kidney disease: Secondary | ICD-10-CM | POA: Diagnosis not present

## 2018-01-14 DIAGNOSIS — F329 Major depressive disorder, single episode, unspecified: Secondary | ICD-10-CM | POA: Diagnosis not present

## 2018-01-14 DIAGNOSIS — S329XXD Fracture of unspecified parts of lumbosacral spine and pelvis, subsequent encounter for fracture with routine healing: Secondary | ICD-10-CM | POA: Diagnosis not present

## 2018-01-16 DIAGNOSIS — S42294D Other nondisplaced fracture of upper end of right humerus, subsequent encounter for fracture with routine healing: Secondary | ICD-10-CM | POA: Diagnosis not present

## 2018-01-16 DIAGNOSIS — I130 Hypertensive heart and chronic kidney disease with heart failure and stage 1 through stage 4 chronic kidney disease, or unspecified chronic kidney disease: Secondary | ICD-10-CM | POA: Diagnosis not present

## 2018-01-16 DIAGNOSIS — S329XXD Fracture of unspecified parts of lumbosacral spine and pelvis, subsequent encounter for fracture with routine healing: Secondary | ICD-10-CM | POA: Diagnosis not present

## 2018-01-16 DIAGNOSIS — F329 Major depressive disorder, single episode, unspecified: Secondary | ICD-10-CM | POA: Diagnosis not present

## 2018-01-16 DIAGNOSIS — W19XXXD Unspecified fall, subsequent encounter: Secondary | ICD-10-CM | POA: Diagnosis not present

## 2018-01-16 DIAGNOSIS — S4291XD Fracture of right shoulder girdle, part unspecified, subsequent encounter for fracture with routine healing: Secondary | ICD-10-CM | POA: Diagnosis not present

## 2018-01-17 DIAGNOSIS — I130 Hypertensive heart and chronic kidney disease with heart failure and stage 1 through stage 4 chronic kidney disease, or unspecified chronic kidney disease: Secondary | ICD-10-CM | POA: Diagnosis not present

## 2018-01-17 DIAGNOSIS — W19XXXD Unspecified fall, subsequent encounter: Secondary | ICD-10-CM | POA: Diagnosis not present

## 2018-01-17 DIAGNOSIS — S42294D Other nondisplaced fracture of upper end of right humerus, subsequent encounter for fracture with routine healing: Secondary | ICD-10-CM | POA: Diagnosis not present

## 2018-01-17 DIAGNOSIS — F329 Major depressive disorder, single episode, unspecified: Secondary | ICD-10-CM | POA: Diagnosis not present

## 2018-01-17 DIAGNOSIS — S4291XD Fracture of right shoulder girdle, part unspecified, subsequent encounter for fracture with routine healing: Secondary | ICD-10-CM | POA: Diagnosis not present

## 2018-01-17 DIAGNOSIS — S329XXD Fracture of unspecified parts of lumbosacral spine and pelvis, subsequent encounter for fracture with routine healing: Secondary | ICD-10-CM | POA: Diagnosis not present

## 2018-01-19 DIAGNOSIS — S4291XD Fracture of right shoulder girdle, part unspecified, subsequent encounter for fracture with routine healing: Secondary | ICD-10-CM | POA: Diagnosis not present

## 2018-01-19 DIAGNOSIS — I130 Hypertensive heart and chronic kidney disease with heart failure and stage 1 through stage 4 chronic kidney disease, or unspecified chronic kidney disease: Secondary | ICD-10-CM | POA: Diagnosis not present

## 2018-01-19 DIAGNOSIS — W19XXXD Unspecified fall, subsequent encounter: Secondary | ICD-10-CM | POA: Diagnosis not present

## 2018-01-19 DIAGNOSIS — S42294D Other nondisplaced fracture of upper end of right humerus, subsequent encounter for fracture with routine healing: Secondary | ICD-10-CM | POA: Diagnosis not present

## 2018-01-19 DIAGNOSIS — F329 Major depressive disorder, single episode, unspecified: Secondary | ICD-10-CM | POA: Diagnosis not present

## 2018-01-19 DIAGNOSIS — S329XXD Fracture of unspecified parts of lumbosacral spine and pelvis, subsequent encounter for fracture with routine healing: Secondary | ICD-10-CM | POA: Diagnosis not present

## 2018-01-21 DIAGNOSIS — F329 Major depressive disorder, single episode, unspecified: Secondary | ICD-10-CM | POA: Diagnosis not present

## 2018-01-21 DIAGNOSIS — S4291XD Fracture of right shoulder girdle, part unspecified, subsequent encounter for fracture with routine healing: Secondary | ICD-10-CM | POA: Diagnosis not present

## 2018-01-21 DIAGNOSIS — S42294D Other nondisplaced fracture of upper end of right humerus, subsequent encounter for fracture with routine healing: Secondary | ICD-10-CM | POA: Diagnosis not present

## 2018-01-21 DIAGNOSIS — I130 Hypertensive heart and chronic kidney disease with heart failure and stage 1 through stage 4 chronic kidney disease, or unspecified chronic kidney disease: Secondary | ICD-10-CM | POA: Diagnosis not present

## 2018-01-21 DIAGNOSIS — S329XXD Fracture of unspecified parts of lumbosacral spine and pelvis, subsequent encounter for fracture with routine healing: Secondary | ICD-10-CM | POA: Diagnosis not present

## 2018-01-21 DIAGNOSIS — W19XXXD Unspecified fall, subsequent encounter: Secondary | ICD-10-CM | POA: Diagnosis not present

## 2018-01-23 DIAGNOSIS — S4291XD Fracture of right shoulder girdle, part unspecified, subsequent encounter for fracture with routine healing: Secondary | ICD-10-CM | POA: Diagnosis not present

## 2018-01-23 DIAGNOSIS — F329 Major depressive disorder, single episode, unspecified: Secondary | ICD-10-CM | POA: Diagnosis not present

## 2018-01-23 DIAGNOSIS — W19XXXD Unspecified fall, subsequent encounter: Secondary | ICD-10-CM | POA: Diagnosis not present

## 2018-01-23 DIAGNOSIS — S329XXD Fracture of unspecified parts of lumbosacral spine and pelvis, subsequent encounter for fracture with routine healing: Secondary | ICD-10-CM | POA: Diagnosis not present

## 2018-01-23 DIAGNOSIS — I130 Hypertensive heart and chronic kidney disease with heart failure and stage 1 through stage 4 chronic kidney disease, or unspecified chronic kidney disease: Secondary | ICD-10-CM | POA: Diagnosis not present

## 2018-01-23 DIAGNOSIS — S42294D Other nondisplaced fracture of upper end of right humerus, subsequent encounter for fracture with routine healing: Secondary | ICD-10-CM | POA: Diagnosis not present

## 2018-01-27 DIAGNOSIS — S4291XD Fracture of right shoulder girdle, part unspecified, subsequent encounter for fracture with routine healing: Secondary | ICD-10-CM | POA: Diagnosis not present

## 2018-01-27 DIAGNOSIS — R52 Pain, unspecified: Secondary | ICD-10-CM | POA: Diagnosis not present

## 2018-01-27 DIAGNOSIS — S32810D Multiple fractures of pelvis with stable disruption of pelvic ring, subsequent encounter for fracture with routine healing: Secondary | ICD-10-CM | POA: Diagnosis not present

## 2018-01-27 DIAGNOSIS — W19XXXD Unspecified fall, subsequent encounter: Secondary | ICD-10-CM | POA: Diagnosis not present

## 2018-01-27 DIAGNOSIS — S42201D Unspecified fracture of upper end of right humerus, subsequent encounter for fracture with routine healing: Secondary | ICD-10-CM | POA: Diagnosis not present

## 2018-01-27 DIAGNOSIS — F329 Major depressive disorder, single episode, unspecified: Secondary | ICD-10-CM | POA: Diagnosis not present

## 2018-01-27 DIAGNOSIS — S42294D Other nondisplaced fracture of upper end of right humerus, subsequent encounter for fracture with routine healing: Secondary | ICD-10-CM | POA: Diagnosis not present

## 2018-01-27 DIAGNOSIS — I130 Hypertensive heart and chronic kidney disease with heart failure and stage 1 through stage 4 chronic kidney disease, or unspecified chronic kidney disease: Secondary | ICD-10-CM | POA: Diagnosis not present

## 2018-01-27 DIAGNOSIS — I10 Essential (primary) hypertension: Secondary | ICD-10-CM | POA: Diagnosis not present

## 2018-01-27 DIAGNOSIS — S329XXD Fracture of unspecified parts of lumbosacral spine and pelvis, subsequent encounter for fracture with routine healing: Secondary | ICD-10-CM | POA: Diagnosis not present

## 2018-01-28 DIAGNOSIS — W19XXXD Unspecified fall, subsequent encounter: Secondary | ICD-10-CM | POA: Diagnosis not present

## 2018-01-28 DIAGNOSIS — I130 Hypertensive heart and chronic kidney disease with heart failure and stage 1 through stage 4 chronic kidney disease, or unspecified chronic kidney disease: Secondary | ICD-10-CM | POA: Diagnosis not present

## 2018-01-28 DIAGNOSIS — S329XXD Fracture of unspecified parts of lumbosacral spine and pelvis, subsequent encounter for fracture with routine healing: Secondary | ICD-10-CM | POA: Diagnosis not present

## 2018-01-28 DIAGNOSIS — S4291XD Fracture of right shoulder girdle, part unspecified, subsequent encounter for fracture with routine healing: Secondary | ICD-10-CM | POA: Diagnosis not present

## 2018-01-28 DIAGNOSIS — S42294D Other nondisplaced fracture of upper end of right humerus, subsequent encounter for fracture with routine healing: Secondary | ICD-10-CM | POA: Diagnosis not present

## 2018-01-28 DIAGNOSIS — F329 Major depressive disorder, single episode, unspecified: Secondary | ICD-10-CM | POA: Diagnosis not present

## 2018-01-29 DIAGNOSIS — I130 Hypertensive heart and chronic kidney disease with heart failure and stage 1 through stage 4 chronic kidney disease, or unspecified chronic kidney disease: Secondary | ICD-10-CM | POA: Diagnosis not present

## 2018-01-29 DIAGNOSIS — W19XXXD Unspecified fall, subsequent encounter: Secondary | ICD-10-CM | POA: Diagnosis not present

## 2018-01-29 DIAGNOSIS — S329XXD Fracture of unspecified parts of lumbosacral spine and pelvis, subsequent encounter for fracture with routine healing: Secondary | ICD-10-CM | POA: Diagnosis not present

## 2018-01-29 DIAGNOSIS — S42294D Other nondisplaced fracture of upper end of right humerus, subsequent encounter for fracture with routine healing: Secondary | ICD-10-CM | POA: Diagnosis not present

## 2018-01-29 DIAGNOSIS — F329 Major depressive disorder, single episode, unspecified: Secondary | ICD-10-CM | POA: Diagnosis not present

## 2018-01-29 DIAGNOSIS — S4291XD Fracture of right shoulder girdle, part unspecified, subsequent encounter for fracture with routine healing: Secondary | ICD-10-CM | POA: Diagnosis not present

## 2018-01-30 DIAGNOSIS — W19XXXD Unspecified fall, subsequent encounter: Secondary | ICD-10-CM | POA: Diagnosis not present

## 2018-01-30 DIAGNOSIS — S329XXD Fracture of unspecified parts of lumbosacral spine and pelvis, subsequent encounter for fracture with routine healing: Secondary | ICD-10-CM | POA: Diagnosis not present

## 2018-01-30 DIAGNOSIS — F329 Major depressive disorder, single episode, unspecified: Secondary | ICD-10-CM | POA: Diagnosis not present

## 2018-01-30 DIAGNOSIS — S4291XD Fracture of right shoulder girdle, part unspecified, subsequent encounter for fracture with routine healing: Secondary | ICD-10-CM | POA: Diagnosis not present

## 2018-01-30 DIAGNOSIS — S42294D Other nondisplaced fracture of upper end of right humerus, subsequent encounter for fracture with routine healing: Secondary | ICD-10-CM | POA: Diagnosis not present

## 2018-01-30 DIAGNOSIS — I130 Hypertensive heart and chronic kidney disease with heart failure and stage 1 through stage 4 chronic kidney disease, or unspecified chronic kidney disease: Secondary | ICD-10-CM | POA: Diagnosis not present

## 2018-02-02 DIAGNOSIS — W19XXXD Unspecified fall, subsequent encounter: Secondary | ICD-10-CM | POA: Diagnosis not present

## 2018-02-02 DIAGNOSIS — S4291XD Fracture of right shoulder girdle, part unspecified, subsequent encounter for fracture with routine healing: Secondary | ICD-10-CM | POA: Diagnosis not present

## 2018-02-02 DIAGNOSIS — I130 Hypertensive heart and chronic kidney disease with heart failure and stage 1 through stage 4 chronic kidney disease, or unspecified chronic kidney disease: Secondary | ICD-10-CM | POA: Diagnosis not present

## 2018-02-02 DIAGNOSIS — S329XXD Fracture of unspecified parts of lumbosacral spine and pelvis, subsequent encounter for fracture with routine healing: Secondary | ICD-10-CM | POA: Diagnosis not present

## 2018-02-02 DIAGNOSIS — S42294D Other nondisplaced fracture of upper end of right humerus, subsequent encounter for fracture with routine healing: Secondary | ICD-10-CM | POA: Diagnosis not present

## 2018-02-02 DIAGNOSIS — F329 Major depressive disorder, single episode, unspecified: Secondary | ICD-10-CM | POA: Diagnosis not present

## 2018-02-03 DIAGNOSIS — I130 Hypertensive heart and chronic kidney disease with heart failure and stage 1 through stage 4 chronic kidney disease, or unspecified chronic kidney disease: Secondary | ICD-10-CM | POA: Diagnosis not present

## 2018-02-03 DIAGNOSIS — S42294D Other nondisplaced fracture of upper end of right humerus, subsequent encounter for fracture with routine healing: Secondary | ICD-10-CM | POA: Diagnosis not present

## 2018-02-03 DIAGNOSIS — W19XXXD Unspecified fall, subsequent encounter: Secondary | ICD-10-CM | POA: Diagnosis not present

## 2018-02-03 DIAGNOSIS — S4291XD Fracture of right shoulder girdle, part unspecified, subsequent encounter for fracture with routine healing: Secondary | ICD-10-CM | POA: Diagnosis not present

## 2018-02-03 DIAGNOSIS — F329 Major depressive disorder, single episode, unspecified: Secondary | ICD-10-CM | POA: Diagnosis not present

## 2018-02-03 DIAGNOSIS — S329XXD Fracture of unspecified parts of lumbosacral spine and pelvis, subsequent encounter for fracture with routine healing: Secondary | ICD-10-CM | POA: Diagnosis not present

## 2018-02-04 DIAGNOSIS — F329 Major depressive disorder, single episode, unspecified: Secondary | ICD-10-CM | POA: Diagnosis not present

## 2018-02-04 DIAGNOSIS — S4291XD Fracture of right shoulder girdle, part unspecified, subsequent encounter for fracture with routine healing: Secondary | ICD-10-CM | POA: Diagnosis not present

## 2018-02-04 DIAGNOSIS — I130 Hypertensive heart and chronic kidney disease with heart failure and stage 1 through stage 4 chronic kidney disease, or unspecified chronic kidney disease: Secondary | ICD-10-CM | POA: Diagnosis not present

## 2018-02-04 DIAGNOSIS — S329XXD Fracture of unspecified parts of lumbosacral spine and pelvis, subsequent encounter for fracture with routine healing: Secondary | ICD-10-CM | POA: Diagnosis not present

## 2018-02-04 DIAGNOSIS — S42294D Other nondisplaced fracture of upper end of right humerus, subsequent encounter for fracture with routine healing: Secondary | ICD-10-CM | POA: Diagnosis not present

## 2018-02-04 DIAGNOSIS — W19XXXD Unspecified fall, subsequent encounter: Secondary | ICD-10-CM | POA: Diagnosis not present

## 2018-02-05 DIAGNOSIS — S42294D Other nondisplaced fracture of upper end of right humerus, subsequent encounter for fracture with routine healing: Secondary | ICD-10-CM | POA: Diagnosis not present

## 2018-02-05 DIAGNOSIS — S329XXD Fracture of unspecified parts of lumbosacral spine and pelvis, subsequent encounter for fracture with routine healing: Secondary | ICD-10-CM | POA: Diagnosis not present

## 2018-02-05 DIAGNOSIS — F329 Major depressive disorder, single episode, unspecified: Secondary | ICD-10-CM | POA: Diagnosis not present

## 2018-02-05 DIAGNOSIS — I130 Hypertensive heart and chronic kidney disease with heart failure and stage 1 through stage 4 chronic kidney disease, or unspecified chronic kidney disease: Secondary | ICD-10-CM | POA: Diagnosis not present

## 2018-02-05 DIAGNOSIS — W19XXXD Unspecified fall, subsequent encounter: Secondary | ICD-10-CM | POA: Diagnosis not present

## 2018-02-05 DIAGNOSIS — S4291XD Fracture of right shoulder girdle, part unspecified, subsequent encounter for fracture with routine healing: Secondary | ICD-10-CM | POA: Diagnosis not present

## 2018-02-06 DIAGNOSIS — W19XXXD Unspecified fall, subsequent encounter: Secondary | ICD-10-CM | POA: Diagnosis not present

## 2018-02-06 DIAGNOSIS — I130 Hypertensive heart and chronic kidney disease with heart failure and stage 1 through stage 4 chronic kidney disease, or unspecified chronic kidney disease: Secondary | ICD-10-CM | POA: Diagnosis not present

## 2018-02-06 DIAGNOSIS — F329 Major depressive disorder, single episode, unspecified: Secondary | ICD-10-CM | POA: Diagnosis not present

## 2018-02-06 DIAGNOSIS — S42294D Other nondisplaced fracture of upper end of right humerus, subsequent encounter for fracture with routine healing: Secondary | ICD-10-CM | POA: Diagnosis not present

## 2018-02-06 DIAGNOSIS — S329XXD Fracture of unspecified parts of lumbosacral spine and pelvis, subsequent encounter for fracture with routine healing: Secondary | ICD-10-CM | POA: Diagnosis not present

## 2018-02-06 DIAGNOSIS — S4291XD Fracture of right shoulder girdle, part unspecified, subsequent encounter for fracture with routine healing: Secondary | ICD-10-CM | POA: Diagnosis not present

## 2018-02-09 DIAGNOSIS — S329XXD Fracture of unspecified parts of lumbosacral spine and pelvis, subsequent encounter for fracture with routine healing: Secondary | ICD-10-CM | POA: Diagnosis not present

## 2018-02-09 DIAGNOSIS — F329 Major depressive disorder, single episode, unspecified: Secondary | ICD-10-CM | POA: Diagnosis not present

## 2018-02-09 DIAGNOSIS — S4291XD Fracture of right shoulder girdle, part unspecified, subsequent encounter for fracture with routine healing: Secondary | ICD-10-CM | POA: Diagnosis not present

## 2018-02-09 DIAGNOSIS — W19XXXD Unspecified fall, subsequent encounter: Secondary | ICD-10-CM | POA: Diagnosis not present

## 2018-02-09 DIAGNOSIS — I130 Hypertensive heart and chronic kidney disease with heart failure and stage 1 through stage 4 chronic kidney disease, or unspecified chronic kidney disease: Secondary | ICD-10-CM | POA: Diagnosis not present

## 2018-02-09 DIAGNOSIS — S42294D Other nondisplaced fracture of upper end of right humerus, subsequent encounter for fracture with routine healing: Secondary | ICD-10-CM | POA: Diagnosis not present

## 2018-02-10 DIAGNOSIS — I130 Hypertensive heart and chronic kidney disease with heart failure and stage 1 through stage 4 chronic kidney disease, or unspecified chronic kidney disease: Secondary | ICD-10-CM | POA: Diagnosis not present

## 2018-02-10 DIAGNOSIS — F329 Major depressive disorder, single episode, unspecified: Secondary | ICD-10-CM | POA: Diagnosis not present

## 2018-02-10 DIAGNOSIS — S329XXD Fracture of unspecified parts of lumbosacral spine and pelvis, subsequent encounter for fracture with routine healing: Secondary | ICD-10-CM | POA: Diagnosis not present

## 2018-02-10 DIAGNOSIS — W19XXXD Unspecified fall, subsequent encounter: Secondary | ICD-10-CM | POA: Diagnosis not present

## 2018-02-10 DIAGNOSIS — S42294D Other nondisplaced fracture of upper end of right humerus, subsequent encounter for fracture with routine healing: Secondary | ICD-10-CM | POA: Diagnosis not present

## 2018-02-10 DIAGNOSIS — S4291XD Fracture of right shoulder girdle, part unspecified, subsequent encounter for fracture with routine healing: Secondary | ICD-10-CM | POA: Diagnosis not present

## 2018-02-11 DIAGNOSIS — S329XXD Fracture of unspecified parts of lumbosacral spine and pelvis, subsequent encounter for fracture with routine healing: Secondary | ICD-10-CM | POA: Diagnosis not present

## 2018-02-11 DIAGNOSIS — F329 Major depressive disorder, single episode, unspecified: Secondary | ICD-10-CM | POA: Diagnosis not present

## 2018-02-11 DIAGNOSIS — W19XXXD Unspecified fall, subsequent encounter: Secondary | ICD-10-CM | POA: Diagnosis not present

## 2018-02-11 DIAGNOSIS — S42294D Other nondisplaced fracture of upper end of right humerus, subsequent encounter for fracture with routine healing: Secondary | ICD-10-CM | POA: Diagnosis not present

## 2018-02-11 DIAGNOSIS — S4291XD Fracture of right shoulder girdle, part unspecified, subsequent encounter for fracture with routine healing: Secondary | ICD-10-CM | POA: Diagnosis not present

## 2018-02-11 DIAGNOSIS — I130 Hypertensive heart and chronic kidney disease with heart failure and stage 1 through stage 4 chronic kidney disease, or unspecified chronic kidney disease: Secondary | ICD-10-CM | POA: Diagnosis not present

## 2018-02-12 DIAGNOSIS — I130 Hypertensive heart and chronic kidney disease with heart failure and stage 1 through stage 4 chronic kidney disease, or unspecified chronic kidney disease: Secondary | ICD-10-CM | POA: Diagnosis not present

## 2018-02-12 DIAGNOSIS — S42294D Other nondisplaced fracture of upper end of right humerus, subsequent encounter for fracture with routine healing: Secondary | ICD-10-CM | POA: Diagnosis not present

## 2018-02-12 DIAGNOSIS — W19XXXD Unspecified fall, subsequent encounter: Secondary | ICD-10-CM | POA: Diagnosis not present

## 2018-02-12 DIAGNOSIS — S329XXD Fracture of unspecified parts of lumbosacral spine and pelvis, subsequent encounter for fracture with routine healing: Secondary | ICD-10-CM | POA: Diagnosis not present

## 2018-02-12 DIAGNOSIS — F329 Major depressive disorder, single episode, unspecified: Secondary | ICD-10-CM | POA: Diagnosis not present

## 2018-02-12 DIAGNOSIS — S4291XD Fracture of right shoulder girdle, part unspecified, subsequent encounter for fracture with routine healing: Secondary | ICD-10-CM | POA: Diagnosis not present

## 2018-02-16 DIAGNOSIS — S329XXD Fracture of unspecified parts of lumbosacral spine and pelvis, subsequent encounter for fracture with routine healing: Secondary | ICD-10-CM | POA: Diagnosis not present

## 2018-02-16 DIAGNOSIS — S42294D Other nondisplaced fracture of upper end of right humerus, subsequent encounter for fracture with routine healing: Secondary | ICD-10-CM | POA: Diagnosis not present

## 2018-02-16 DIAGNOSIS — F329 Major depressive disorder, single episode, unspecified: Secondary | ICD-10-CM | POA: Diagnosis not present

## 2018-02-16 DIAGNOSIS — S4291XD Fracture of right shoulder girdle, part unspecified, subsequent encounter for fracture with routine healing: Secondary | ICD-10-CM | POA: Diagnosis not present

## 2018-02-16 DIAGNOSIS — I130 Hypertensive heart and chronic kidney disease with heart failure and stage 1 through stage 4 chronic kidney disease, or unspecified chronic kidney disease: Secondary | ICD-10-CM | POA: Diagnosis not present

## 2018-02-16 DIAGNOSIS — W19XXXD Unspecified fall, subsequent encounter: Secondary | ICD-10-CM | POA: Diagnosis not present

## 2018-02-18 DIAGNOSIS — S329XXD Fracture of unspecified parts of lumbosacral spine and pelvis, subsequent encounter for fracture with routine healing: Secondary | ICD-10-CM | POA: Diagnosis not present

## 2018-02-18 DIAGNOSIS — S4291XD Fracture of right shoulder girdle, part unspecified, subsequent encounter for fracture with routine healing: Secondary | ICD-10-CM | POA: Diagnosis not present

## 2018-02-18 DIAGNOSIS — I130 Hypertensive heart and chronic kidney disease with heart failure and stage 1 through stage 4 chronic kidney disease, or unspecified chronic kidney disease: Secondary | ICD-10-CM | POA: Diagnosis not present

## 2018-02-18 DIAGNOSIS — W19XXXD Unspecified fall, subsequent encounter: Secondary | ICD-10-CM | POA: Diagnosis not present

## 2018-02-18 DIAGNOSIS — F329 Major depressive disorder, single episode, unspecified: Secondary | ICD-10-CM | POA: Diagnosis not present

## 2018-02-18 DIAGNOSIS — S42294D Other nondisplaced fracture of upper end of right humerus, subsequent encounter for fracture with routine healing: Secondary | ICD-10-CM | POA: Diagnosis not present

## 2018-02-20 DIAGNOSIS — S4291XD Fracture of right shoulder girdle, part unspecified, subsequent encounter for fracture with routine healing: Secondary | ICD-10-CM | POA: Diagnosis not present

## 2018-02-20 DIAGNOSIS — I130 Hypertensive heart and chronic kidney disease with heart failure and stage 1 through stage 4 chronic kidney disease, or unspecified chronic kidney disease: Secondary | ICD-10-CM | POA: Diagnosis not present

## 2018-02-20 DIAGNOSIS — W19XXXD Unspecified fall, subsequent encounter: Secondary | ICD-10-CM | POA: Diagnosis not present

## 2018-02-20 DIAGNOSIS — F329 Major depressive disorder, single episode, unspecified: Secondary | ICD-10-CM | POA: Diagnosis not present

## 2018-02-20 DIAGNOSIS — S42294D Other nondisplaced fracture of upper end of right humerus, subsequent encounter for fracture with routine healing: Secondary | ICD-10-CM | POA: Diagnosis not present

## 2018-02-20 DIAGNOSIS — S329XXD Fracture of unspecified parts of lumbosacral spine and pelvis, subsequent encounter for fracture with routine healing: Secondary | ICD-10-CM | POA: Diagnosis not present

## 2018-02-23 DIAGNOSIS — W19XXXD Unspecified fall, subsequent encounter: Secondary | ICD-10-CM | POA: Diagnosis not present

## 2018-02-23 DIAGNOSIS — S329XXD Fracture of unspecified parts of lumbosacral spine and pelvis, subsequent encounter for fracture with routine healing: Secondary | ICD-10-CM | POA: Diagnosis not present

## 2018-02-23 DIAGNOSIS — S42294D Other nondisplaced fracture of upper end of right humerus, subsequent encounter for fracture with routine healing: Secondary | ICD-10-CM | POA: Diagnosis not present

## 2018-02-23 DIAGNOSIS — F329 Major depressive disorder, single episode, unspecified: Secondary | ICD-10-CM | POA: Diagnosis not present

## 2018-02-23 DIAGNOSIS — I130 Hypertensive heart and chronic kidney disease with heart failure and stage 1 through stage 4 chronic kidney disease, or unspecified chronic kidney disease: Secondary | ICD-10-CM | POA: Diagnosis not present

## 2018-02-23 DIAGNOSIS — S4291XD Fracture of right shoulder girdle, part unspecified, subsequent encounter for fracture with routine healing: Secondary | ICD-10-CM | POA: Diagnosis not present

## 2018-02-24 DIAGNOSIS — I10 Essential (primary) hypertension: Secondary | ICD-10-CM | POA: Diagnosis not present

## 2018-02-24 DIAGNOSIS — S32810D Multiple fractures of pelvis with stable disruption of pelvic ring, subsequent encounter for fracture with routine healing: Secondary | ICD-10-CM | POA: Diagnosis not present

## 2018-02-24 DIAGNOSIS — R52 Pain, unspecified: Secondary | ICD-10-CM | POA: Diagnosis not present

## 2018-02-24 DIAGNOSIS — S42201D Unspecified fracture of upper end of right humerus, subsequent encounter for fracture with routine healing: Secondary | ICD-10-CM | POA: Diagnosis not present

## 2018-02-25 DIAGNOSIS — I130 Hypertensive heart and chronic kidney disease with heart failure and stage 1 through stage 4 chronic kidney disease, or unspecified chronic kidney disease: Secondary | ICD-10-CM | POA: Diagnosis not present

## 2018-02-25 DIAGNOSIS — F329 Major depressive disorder, single episode, unspecified: Secondary | ICD-10-CM | POA: Diagnosis not present

## 2018-02-25 DIAGNOSIS — S4291XD Fracture of right shoulder girdle, part unspecified, subsequent encounter for fracture with routine healing: Secondary | ICD-10-CM | POA: Diagnosis not present

## 2018-02-25 DIAGNOSIS — S42294D Other nondisplaced fracture of upper end of right humerus, subsequent encounter for fracture with routine healing: Secondary | ICD-10-CM | POA: Diagnosis not present

## 2018-02-25 DIAGNOSIS — W19XXXD Unspecified fall, subsequent encounter: Secondary | ICD-10-CM | POA: Diagnosis not present

## 2018-02-25 DIAGNOSIS — S329XXD Fracture of unspecified parts of lumbosacral spine and pelvis, subsequent encounter for fracture with routine healing: Secondary | ICD-10-CM | POA: Diagnosis not present

## 2018-02-26 DIAGNOSIS — S4291XD Fracture of right shoulder girdle, part unspecified, subsequent encounter for fracture with routine healing: Secondary | ICD-10-CM | POA: Diagnosis not present

## 2018-02-26 DIAGNOSIS — W19XXXD Unspecified fall, subsequent encounter: Secondary | ICD-10-CM | POA: Diagnosis not present

## 2018-02-26 DIAGNOSIS — S42294D Other nondisplaced fracture of upper end of right humerus, subsequent encounter for fracture with routine healing: Secondary | ICD-10-CM | POA: Diagnosis not present

## 2018-02-26 DIAGNOSIS — S329XXD Fracture of unspecified parts of lumbosacral spine and pelvis, subsequent encounter for fracture with routine healing: Secondary | ICD-10-CM | POA: Diagnosis not present

## 2018-02-26 DIAGNOSIS — F329 Major depressive disorder, single episode, unspecified: Secondary | ICD-10-CM | POA: Diagnosis not present

## 2018-02-26 DIAGNOSIS — I130 Hypertensive heart and chronic kidney disease with heart failure and stage 1 through stage 4 chronic kidney disease, or unspecified chronic kidney disease: Secondary | ICD-10-CM | POA: Diagnosis not present

## 2018-03-02 DIAGNOSIS — W19XXXD Unspecified fall, subsequent encounter: Secondary | ICD-10-CM | POA: Diagnosis not present

## 2018-03-02 DIAGNOSIS — F329 Major depressive disorder, single episode, unspecified: Secondary | ICD-10-CM | POA: Diagnosis not present

## 2018-03-02 DIAGNOSIS — S4291XD Fracture of right shoulder girdle, part unspecified, subsequent encounter for fracture with routine healing: Secondary | ICD-10-CM | POA: Diagnosis not present

## 2018-03-02 DIAGNOSIS — S329XXD Fracture of unspecified parts of lumbosacral spine and pelvis, subsequent encounter for fracture with routine healing: Secondary | ICD-10-CM | POA: Diagnosis not present

## 2018-03-02 DIAGNOSIS — S42294D Other nondisplaced fracture of upper end of right humerus, subsequent encounter for fracture with routine healing: Secondary | ICD-10-CM | POA: Diagnosis not present

## 2018-03-02 DIAGNOSIS — I130 Hypertensive heart and chronic kidney disease with heart failure and stage 1 through stage 4 chronic kidney disease, or unspecified chronic kidney disease: Secondary | ICD-10-CM | POA: Diagnosis not present

## 2018-03-03 DIAGNOSIS — S42294D Other nondisplaced fracture of upper end of right humerus, subsequent encounter for fracture with routine healing: Secondary | ICD-10-CM | POA: Diagnosis not present

## 2018-03-03 DIAGNOSIS — I130 Hypertensive heart and chronic kidney disease with heart failure and stage 1 through stage 4 chronic kidney disease, or unspecified chronic kidney disease: Secondary | ICD-10-CM | POA: Diagnosis not present

## 2018-03-03 DIAGNOSIS — W19XXXD Unspecified fall, subsequent encounter: Secondary | ICD-10-CM | POA: Diagnosis not present

## 2018-03-03 DIAGNOSIS — S4291XD Fracture of right shoulder girdle, part unspecified, subsequent encounter for fracture with routine healing: Secondary | ICD-10-CM | POA: Diagnosis not present

## 2018-03-03 DIAGNOSIS — F329 Major depressive disorder, single episode, unspecified: Secondary | ICD-10-CM | POA: Diagnosis not present

## 2018-03-03 DIAGNOSIS — S329XXD Fracture of unspecified parts of lumbosacral spine and pelvis, subsequent encounter for fracture with routine healing: Secondary | ICD-10-CM | POA: Diagnosis not present

## 2018-03-04 DIAGNOSIS — F329 Major depressive disorder, single episode, unspecified: Secondary | ICD-10-CM | POA: Diagnosis not present

## 2018-03-04 DIAGNOSIS — S329XXD Fracture of unspecified parts of lumbosacral spine and pelvis, subsequent encounter for fracture with routine healing: Secondary | ICD-10-CM | POA: Diagnosis not present

## 2018-03-04 DIAGNOSIS — S4291XD Fracture of right shoulder girdle, part unspecified, subsequent encounter for fracture with routine healing: Secondary | ICD-10-CM | POA: Diagnosis not present

## 2018-03-04 DIAGNOSIS — I130 Hypertensive heart and chronic kidney disease with heart failure and stage 1 through stage 4 chronic kidney disease, or unspecified chronic kidney disease: Secondary | ICD-10-CM | POA: Diagnosis not present

## 2018-03-04 DIAGNOSIS — S42294D Other nondisplaced fracture of upper end of right humerus, subsequent encounter for fracture with routine healing: Secondary | ICD-10-CM | POA: Diagnosis not present

## 2018-03-04 DIAGNOSIS — W19XXXD Unspecified fall, subsequent encounter: Secondary | ICD-10-CM | POA: Diagnosis not present

## 2018-03-09 DIAGNOSIS — S42294D Other nondisplaced fracture of upper end of right humerus, subsequent encounter for fracture with routine healing: Secondary | ICD-10-CM | POA: Diagnosis not present

## 2018-03-09 DIAGNOSIS — S4291XD Fracture of right shoulder girdle, part unspecified, subsequent encounter for fracture with routine healing: Secondary | ICD-10-CM | POA: Diagnosis not present

## 2018-03-09 DIAGNOSIS — S329XXD Fracture of unspecified parts of lumbosacral spine and pelvis, subsequent encounter for fracture with routine healing: Secondary | ICD-10-CM | POA: Diagnosis not present

## 2018-03-09 DIAGNOSIS — I130 Hypertensive heart and chronic kidney disease with heart failure and stage 1 through stage 4 chronic kidney disease, or unspecified chronic kidney disease: Secondary | ICD-10-CM | POA: Diagnosis not present

## 2018-03-09 DIAGNOSIS — W19XXXD Unspecified fall, subsequent encounter: Secondary | ICD-10-CM | POA: Diagnosis not present

## 2018-03-09 DIAGNOSIS — F329 Major depressive disorder, single episode, unspecified: Secondary | ICD-10-CM | POA: Diagnosis not present

## 2018-03-11 DIAGNOSIS — I130 Hypertensive heart and chronic kidney disease with heart failure and stage 1 through stage 4 chronic kidney disease, or unspecified chronic kidney disease: Secondary | ICD-10-CM | POA: Diagnosis not present

## 2018-03-11 DIAGNOSIS — S4291XD Fracture of right shoulder girdle, part unspecified, subsequent encounter for fracture with routine healing: Secondary | ICD-10-CM | POA: Diagnosis not present

## 2018-03-11 DIAGNOSIS — W19XXXD Unspecified fall, subsequent encounter: Secondary | ICD-10-CM | POA: Diagnosis not present

## 2018-03-11 DIAGNOSIS — S42294D Other nondisplaced fracture of upper end of right humerus, subsequent encounter for fracture with routine healing: Secondary | ICD-10-CM | POA: Diagnosis not present

## 2018-03-11 DIAGNOSIS — F329 Major depressive disorder, single episode, unspecified: Secondary | ICD-10-CM | POA: Diagnosis not present

## 2018-03-11 DIAGNOSIS — S329XXD Fracture of unspecified parts of lumbosacral spine and pelvis, subsequent encounter for fracture with routine healing: Secondary | ICD-10-CM | POA: Diagnosis not present

## 2018-03-13 DIAGNOSIS — S4291XD Fracture of right shoulder girdle, part unspecified, subsequent encounter for fracture with routine healing: Secondary | ICD-10-CM | POA: Diagnosis not present

## 2018-03-13 DIAGNOSIS — K219 Gastro-esophageal reflux disease without esophagitis: Secondary | ICD-10-CM | POA: Diagnosis not present

## 2018-03-13 DIAGNOSIS — I130 Hypertensive heart and chronic kidney disease with heart failure and stage 1 through stage 4 chronic kidney disease, or unspecified chronic kidney disease: Secondary | ICD-10-CM | POA: Diagnosis not present

## 2018-03-13 DIAGNOSIS — W19XXXD Unspecified fall, subsequent encounter: Secondary | ICD-10-CM | POA: Diagnosis not present

## 2018-03-13 DIAGNOSIS — F3289 Other specified depressive episodes: Secondary | ICD-10-CM | POA: Diagnosis not present

## 2018-03-13 DIAGNOSIS — I509 Heart failure, unspecified: Secondary | ICD-10-CM | POA: Diagnosis not present

## 2018-03-13 DIAGNOSIS — Z7902 Long term (current) use of antithrombotics/antiplatelets: Secondary | ICD-10-CM | POA: Diagnosis not present

## 2018-03-13 DIAGNOSIS — E1122 Type 2 diabetes mellitus with diabetic chronic kidney disease: Secondary | ICD-10-CM | POA: Diagnosis not present

## 2018-03-13 DIAGNOSIS — N183 Chronic kidney disease, stage 3 (moderate): Secondary | ICD-10-CM | POA: Diagnosis not present

## 2018-03-13 DIAGNOSIS — S42294D Other nondisplaced fracture of upper end of right humerus, subsequent encounter for fracture with routine healing: Secondary | ICD-10-CM | POA: Diagnosis not present

## 2018-03-13 DIAGNOSIS — F329 Major depressive disorder, single episode, unspecified: Secondary | ICD-10-CM | POA: Diagnosis not present

## 2018-03-13 DIAGNOSIS — Z9181 History of falling: Secondary | ICD-10-CM | POA: Diagnosis not present

## 2018-03-13 DIAGNOSIS — S329XXD Fracture of unspecified parts of lumbosacral spine and pelvis, subsequent encounter for fracture with routine healing: Secondary | ICD-10-CM | POA: Diagnosis not present

## 2018-03-16 DIAGNOSIS — F329 Major depressive disorder, single episode, unspecified: Secondary | ICD-10-CM | POA: Diagnosis not present

## 2018-03-16 DIAGNOSIS — S4291XD Fracture of right shoulder girdle, part unspecified, subsequent encounter for fracture with routine healing: Secondary | ICD-10-CM | POA: Diagnosis not present

## 2018-03-16 DIAGNOSIS — W19XXXD Unspecified fall, subsequent encounter: Secondary | ICD-10-CM | POA: Diagnosis not present

## 2018-03-16 DIAGNOSIS — I130 Hypertensive heart and chronic kidney disease with heart failure and stage 1 through stage 4 chronic kidney disease, or unspecified chronic kidney disease: Secondary | ICD-10-CM | POA: Diagnosis not present

## 2018-03-16 DIAGNOSIS — S42294D Other nondisplaced fracture of upper end of right humerus, subsequent encounter for fracture with routine healing: Secondary | ICD-10-CM | POA: Diagnosis not present

## 2018-03-16 DIAGNOSIS — S329XXD Fracture of unspecified parts of lumbosacral spine and pelvis, subsequent encounter for fracture with routine healing: Secondary | ICD-10-CM | POA: Diagnosis not present

## 2018-03-18 DIAGNOSIS — S329XXD Fracture of unspecified parts of lumbosacral spine and pelvis, subsequent encounter for fracture with routine healing: Secondary | ICD-10-CM | POA: Diagnosis not present

## 2018-03-18 DIAGNOSIS — S4291XD Fracture of right shoulder girdle, part unspecified, subsequent encounter for fracture with routine healing: Secondary | ICD-10-CM | POA: Diagnosis not present

## 2018-03-18 DIAGNOSIS — I130 Hypertensive heart and chronic kidney disease with heart failure and stage 1 through stage 4 chronic kidney disease, or unspecified chronic kidney disease: Secondary | ICD-10-CM | POA: Diagnosis not present

## 2018-03-18 DIAGNOSIS — F329 Major depressive disorder, single episode, unspecified: Secondary | ICD-10-CM | POA: Diagnosis not present

## 2018-03-18 DIAGNOSIS — S42294D Other nondisplaced fracture of upper end of right humerus, subsequent encounter for fracture with routine healing: Secondary | ICD-10-CM | POA: Diagnosis not present

## 2018-03-18 DIAGNOSIS — W19XXXD Unspecified fall, subsequent encounter: Secondary | ICD-10-CM | POA: Diagnosis not present

## 2018-03-23 DIAGNOSIS — W19XXXD Unspecified fall, subsequent encounter: Secondary | ICD-10-CM | POA: Diagnosis not present

## 2018-03-23 DIAGNOSIS — I130 Hypertensive heart and chronic kidney disease with heart failure and stage 1 through stage 4 chronic kidney disease, or unspecified chronic kidney disease: Secondary | ICD-10-CM | POA: Diagnosis not present

## 2018-03-23 DIAGNOSIS — S4291XD Fracture of right shoulder girdle, part unspecified, subsequent encounter for fracture with routine healing: Secondary | ICD-10-CM | POA: Diagnosis not present

## 2018-03-23 DIAGNOSIS — F329 Major depressive disorder, single episode, unspecified: Secondary | ICD-10-CM | POA: Diagnosis not present

## 2018-03-23 DIAGNOSIS — S329XXD Fracture of unspecified parts of lumbosacral spine and pelvis, subsequent encounter for fracture with routine healing: Secondary | ICD-10-CM | POA: Diagnosis not present

## 2018-03-23 DIAGNOSIS — S42294D Other nondisplaced fracture of upper end of right humerus, subsequent encounter for fracture with routine healing: Secondary | ICD-10-CM | POA: Diagnosis not present

## 2018-03-24 DIAGNOSIS — I130 Hypertensive heart and chronic kidney disease with heart failure and stage 1 through stage 4 chronic kidney disease, or unspecified chronic kidney disease: Secondary | ICD-10-CM | POA: Diagnosis not present

## 2018-03-24 DIAGNOSIS — S42294D Other nondisplaced fracture of upper end of right humerus, subsequent encounter for fracture with routine healing: Secondary | ICD-10-CM | POA: Diagnosis not present

## 2018-03-24 DIAGNOSIS — F329 Major depressive disorder, single episode, unspecified: Secondary | ICD-10-CM | POA: Diagnosis not present

## 2018-03-24 DIAGNOSIS — S4291XD Fracture of right shoulder girdle, part unspecified, subsequent encounter for fracture with routine healing: Secondary | ICD-10-CM | POA: Diagnosis not present

## 2018-03-24 DIAGNOSIS — W19XXXD Unspecified fall, subsequent encounter: Secondary | ICD-10-CM | POA: Diagnosis not present

## 2018-03-24 DIAGNOSIS — S329XXD Fracture of unspecified parts of lumbosacral spine and pelvis, subsequent encounter for fracture with routine healing: Secondary | ICD-10-CM | POA: Diagnosis not present

## 2018-03-25 DIAGNOSIS — S42294D Other nondisplaced fracture of upper end of right humerus, subsequent encounter for fracture with routine healing: Secondary | ICD-10-CM | POA: Diagnosis not present

## 2018-03-25 DIAGNOSIS — S329XXD Fracture of unspecified parts of lumbosacral spine and pelvis, subsequent encounter for fracture with routine healing: Secondary | ICD-10-CM | POA: Diagnosis not present

## 2018-03-25 DIAGNOSIS — S4291XD Fracture of right shoulder girdle, part unspecified, subsequent encounter for fracture with routine healing: Secondary | ICD-10-CM | POA: Diagnosis not present

## 2018-03-25 DIAGNOSIS — I130 Hypertensive heart and chronic kidney disease with heart failure and stage 1 through stage 4 chronic kidney disease, or unspecified chronic kidney disease: Secondary | ICD-10-CM | POA: Diagnosis not present

## 2018-03-25 DIAGNOSIS — F329 Major depressive disorder, single episode, unspecified: Secondary | ICD-10-CM | POA: Diagnosis not present

## 2018-03-25 DIAGNOSIS — W19XXXD Unspecified fall, subsequent encounter: Secondary | ICD-10-CM | POA: Diagnosis not present

## 2018-03-26 DIAGNOSIS — S4291XD Fracture of right shoulder girdle, part unspecified, subsequent encounter for fracture with routine healing: Secondary | ICD-10-CM | POA: Diagnosis not present

## 2018-03-26 DIAGNOSIS — I130 Hypertensive heart and chronic kidney disease with heart failure and stage 1 through stage 4 chronic kidney disease, or unspecified chronic kidney disease: Secondary | ICD-10-CM | POA: Diagnosis not present

## 2018-03-26 DIAGNOSIS — S329XXD Fracture of unspecified parts of lumbosacral spine and pelvis, subsequent encounter for fracture with routine healing: Secondary | ICD-10-CM | POA: Diagnosis not present

## 2018-03-26 DIAGNOSIS — S42294D Other nondisplaced fracture of upper end of right humerus, subsequent encounter for fracture with routine healing: Secondary | ICD-10-CM | POA: Diagnosis not present

## 2018-03-26 DIAGNOSIS — W19XXXD Unspecified fall, subsequent encounter: Secondary | ICD-10-CM | POA: Diagnosis not present

## 2018-03-26 DIAGNOSIS — F329 Major depressive disorder, single episode, unspecified: Secondary | ICD-10-CM | POA: Diagnosis not present

## 2018-03-30 DIAGNOSIS — S329XXD Fracture of unspecified parts of lumbosacral spine and pelvis, subsequent encounter for fracture with routine healing: Secondary | ICD-10-CM | POA: Diagnosis not present

## 2018-03-30 DIAGNOSIS — S4291XD Fracture of right shoulder girdle, part unspecified, subsequent encounter for fracture with routine healing: Secondary | ICD-10-CM | POA: Diagnosis not present

## 2018-03-30 DIAGNOSIS — F329 Major depressive disorder, single episode, unspecified: Secondary | ICD-10-CM | POA: Diagnosis not present

## 2018-03-30 DIAGNOSIS — I130 Hypertensive heart and chronic kidney disease with heart failure and stage 1 through stage 4 chronic kidney disease, or unspecified chronic kidney disease: Secondary | ICD-10-CM | POA: Diagnosis not present

## 2018-03-30 DIAGNOSIS — S42294D Other nondisplaced fracture of upper end of right humerus, subsequent encounter for fracture with routine healing: Secondary | ICD-10-CM | POA: Diagnosis not present

## 2018-03-30 DIAGNOSIS — W19XXXD Unspecified fall, subsequent encounter: Secondary | ICD-10-CM | POA: Diagnosis not present

## 2018-03-31 DIAGNOSIS — S42294D Other nondisplaced fracture of upper end of right humerus, subsequent encounter for fracture with routine healing: Secondary | ICD-10-CM | POA: Diagnosis not present

## 2018-03-31 DIAGNOSIS — I130 Hypertensive heart and chronic kidney disease with heart failure and stage 1 through stage 4 chronic kidney disease, or unspecified chronic kidney disease: Secondary | ICD-10-CM | POA: Diagnosis not present

## 2018-03-31 DIAGNOSIS — S329XXD Fracture of unspecified parts of lumbosacral spine and pelvis, subsequent encounter for fracture with routine healing: Secondary | ICD-10-CM | POA: Diagnosis not present

## 2018-03-31 DIAGNOSIS — S4291XD Fracture of right shoulder girdle, part unspecified, subsequent encounter for fracture with routine healing: Secondary | ICD-10-CM | POA: Diagnosis not present

## 2018-03-31 DIAGNOSIS — F329 Major depressive disorder, single episode, unspecified: Secondary | ICD-10-CM | POA: Diagnosis not present

## 2018-03-31 DIAGNOSIS — W19XXXD Unspecified fall, subsequent encounter: Secondary | ICD-10-CM | POA: Diagnosis not present

## 2018-04-01 DIAGNOSIS — S42294D Other nondisplaced fracture of upper end of right humerus, subsequent encounter for fracture with routine healing: Secondary | ICD-10-CM | POA: Diagnosis not present

## 2018-04-01 DIAGNOSIS — W19XXXD Unspecified fall, subsequent encounter: Secondary | ICD-10-CM | POA: Diagnosis not present

## 2018-04-01 DIAGNOSIS — S329XXD Fracture of unspecified parts of lumbosacral spine and pelvis, subsequent encounter for fracture with routine healing: Secondary | ICD-10-CM | POA: Diagnosis not present

## 2018-04-01 DIAGNOSIS — F329 Major depressive disorder, single episode, unspecified: Secondary | ICD-10-CM | POA: Diagnosis not present

## 2018-04-01 DIAGNOSIS — I130 Hypertensive heart and chronic kidney disease with heart failure and stage 1 through stage 4 chronic kidney disease, or unspecified chronic kidney disease: Secondary | ICD-10-CM | POA: Diagnosis not present

## 2018-04-01 DIAGNOSIS — S4291XD Fracture of right shoulder girdle, part unspecified, subsequent encounter for fracture with routine healing: Secondary | ICD-10-CM | POA: Diagnosis not present

## 2018-04-06 DIAGNOSIS — F329 Major depressive disorder, single episode, unspecified: Secondary | ICD-10-CM | POA: Diagnosis not present

## 2018-04-06 DIAGNOSIS — I130 Hypertensive heart and chronic kidney disease with heart failure and stage 1 through stage 4 chronic kidney disease, or unspecified chronic kidney disease: Secondary | ICD-10-CM | POA: Diagnosis not present

## 2018-04-06 DIAGNOSIS — W19XXXD Unspecified fall, subsequent encounter: Secondary | ICD-10-CM | POA: Diagnosis not present

## 2018-04-06 DIAGNOSIS — S42294D Other nondisplaced fracture of upper end of right humerus, subsequent encounter for fracture with routine healing: Secondary | ICD-10-CM | POA: Diagnosis not present

## 2018-04-06 DIAGNOSIS — S329XXD Fracture of unspecified parts of lumbosacral spine and pelvis, subsequent encounter for fracture with routine healing: Secondary | ICD-10-CM | POA: Diagnosis not present

## 2018-04-06 DIAGNOSIS — S4291XD Fracture of right shoulder girdle, part unspecified, subsequent encounter for fracture with routine healing: Secondary | ICD-10-CM | POA: Diagnosis not present

## 2018-04-07 DIAGNOSIS — S32810D Multiple fractures of pelvis with stable disruption of pelvic ring, subsequent encounter for fracture with routine healing: Secondary | ICD-10-CM | POA: Diagnosis not present

## 2018-04-07 DIAGNOSIS — S42201D Unspecified fracture of upper end of right humerus, subsequent encounter for fracture with routine healing: Secondary | ICD-10-CM | POA: Diagnosis not present

## 2018-04-07 DIAGNOSIS — I10 Essential (primary) hypertension: Secondary | ICD-10-CM | POA: Diagnosis not present

## 2018-04-07 DIAGNOSIS — R52 Pain, unspecified: Secondary | ICD-10-CM | POA: Diagnosis not present

## 2018-04-08 DIAGNOSIS — S42294D Other nondisplaced fracture of upper end of right humerus, subsequent encounter for fracture with routine healing: Secondary | ICD-10-CM | POA: Diagnosis not present

## 2018-04-08 DIAGNOSIS — S329XXD Fracture of unspecified parts of lumbosacral spine and pelvis, subsequent encounter for fracture with routine healing: Secondary | ICD-10-CM | POA: Diagnosis not present

## 2018-04-08 DIAGNOSIS — I130 Hypertensive heart and chronic kidney disease with heart failure and stage 1 through stage 4 chronic kidney disease, or unspecified chronic kidney disease: Secondary | ICD-10-CM | POA: Diagnosis not present

## 2018-04-08 DIAGNOSIS — S4291XD Fracture of right shoulder girdle, part unspecified, subsequent encounter for fracture with routine healing: Secondary | ICD-10-CM | POA: Diagnosis not present

## 2018-04-08 DIAGNOSIS — F329 Major depressive disorder, single episode, unspecified: Secondary | ICD-10-CM | POA: Diagnosis not present

## 2018-04-08 DIAGNOSIS — W19XXXD Unspecified fall, subsequent encounter: Secondary | ICD-10-CM | POA: Diagnosis not present

## 2018-05-05 DIAGNOSIS — E871 Hypo-osmolality and hyponatremia: Secondary | ICD-10-CM | POA: Diagnosis not present

## 2018-05-05 DIAGNOSIS — E876 Hypokalemia: Secondary | ICD-10-CM | POA: Diagnosis not present

## 2018-05-05 DIAGNOSIS — Z23 Encounter for immunization: Secondary | ICD-10-CM | POA: Diagnosis not present

## 2018-05-05 DIAGNOSIS — I10 Essential (primary) hypertension: Secondary | ICD-10-CM | POA: Diagnosis not present

## 2018-05-05 DIAGNOSIS — R413 Other amnesia: Secondary | ICD-10-CM | POA: Diagnosis not present

## 2018-05-05 DIAGNOSIS — Z681 Body mass index (BMI) 19 or less, adult: Secondary | ICD-10-CM | POA: Diagnosis not present

## 2018-05-05 DIAGNOSIS — F39 Unspecified mood [affective] disorder: Secondary | ICD-10-CM | POA: Diagnosis not present

## 2018-05-05 DIAGNOSIS — E038 Other specified hypothyroidism: Secondary | ICD-10-CM | POA: Diagnosis not present

## 2018-06-24 DIAGNOSIS — E7849 Other hyperlipidemia: Secondary | ICD-10-CM | POA: Diagnosis not present

## 2018-06-24 DIAGNOSIS — E038 Other specified hypothyroidism: Secondary | ICD-10-CM | POA: Diagnosis not present

## 2018-06-24 DIAGNOSIS — R7301 Impaired fasting glucose: Secondary | ICD-10-CM | POA: Diagnosis not present

## 2018-06-24 DIAGNOSIS — I10 Essential (primary) hypertension: Secondary | ICD-10-CM | POA: Diagnosis not present

## 2018-07-02 DIAGNOSIS — R82998 Other abnormal findings in urine: Secondary | ICD-10-CM | POA: Diagnosis not present

## 2018-07-13 ENCOUNTER — Other Ambulatory Visit: Payer: Self-pay | Admitting: Cardiology

## 2018-07-15 NOTE — Telephone Encounter (Signed)
Courtesy refill needs OV for further refills  

## 2018-08-11 ENCOUNTER — Other Ambulatory Visit: Payer: Self-pay | Admitting: Cardiology

## 2018-08-20 ENCOUNTER — Other Ambulatory Visit: Payer: Self-pay | Admitting: Cardiology

## 2018-09-08 DIAGNOSIS — G479 Sleep disorder, unspecified: Secondary | ICD-10-CM | POA: Diagnosis not present

## 2018-09-08 DIAGNOSIS — Z681 Body mass index (BMI) 19 or less, adult: Secondary | ICD-10-CM | POA: Diagnosis not present

## 2018-09-08 DIAGNOSIS — R413 Other amnesia: Secondary | ICD-10-CM | POA: Diagnosis not present

## 2018-09-08 DIAGNOSIS — F39 Unspecified mood [affective] disorder: Secondary | ICD-10-CM | POA: Diagnosis not present

## 2018-09-08 DIAGNOSIS — I10 Essential (primary) hypertension: Secondary | ICD-10-CM | POA: Diagnosis not present

## 2018-09-21 ENCOUNTER — Other Ambulatory Visit: Payer: Self-pay | Admitting: Cardiology

## 2018-09-25 ENCOUNTER — Other Ambulatory Visit: Payer: Self-pay | Admitting: Cardiology

## 2018-10-01 DIAGNOSIS — M6281 Muscle weakness (generalized): Secondary | ICD-10-CM | POA: Diagnosis not present

## 2018-10-01 DIAGNOSIS — R2681 Unsteadiness on feet: Secondary | ICD-10-CM | POA: Diagnosis not present

## 2018-10-01 DIAGNOSIS — M25511 Pain in right shoulder: Secondary | ICD-10-CM | POA: Diagnosis not present

## 2018-10-01 DIAGNOSIS — N3946 Mixed incontinence: Secondary | ICD-10-CM | POA: Diagnosis not present

## 2018-10-01 DIAGNOSIS — Z9181 History of falling: Secondary | ICD-10-CM | POA: Diagnosis not present

## 2018-10-01 DIAGNOSIS — R278 Other lack of coordination: Secondary | ICD-10-CM | POA: Diagnosis not present

## 2018-10-02 DIAGNOSIS — N3946 Mixed incontinence: Secondary | ICD-10-CM | POA: Diagnosis not present

## 2018-10-02 DIAGNOSIS — M25511 Pain in right shoulder: Secondary | ICD-10-CM | POA: Diagnosis not present

## 2018-10-02 DIAGNOSIS — Z9181 History of falling: Secondary | ICD-10-CM | POA: Diagnosis not present

## 2018-10-02 DIAGNOSIS — R278 Other lack of coordination: Secondary | ICD-10-CM | POA: Diagnosis not present

## 2018-10-02 DIAGNOSIS — M6281 Muscle weakness (generalized): Secondary | ICD-10-CM | POA: Diagnosis not present

## 2018-10-02 DIAGNOSIS — R2681 Unsteadiness on feet: Secondary | ICD-10-CM | POA: Diagnosis not present

## 2018-10-05 DIAGNOSIS — R2681 Unsteadiness on feet: Secondary | ICD-10-CM | POA: Diagnosis not present

## 2018-10-05 DIAGNOSIS — M6281 Muscle weakness (generalized): Secondary | ICD-10-CM | POA: Diagnosis not present

## 2018-10-05 DIAGNOSIS — Z9181 History of falling: Secondary | ICD-10-CM | POA: Diagnosis not present

## 2018-10-05 DIAGNOSIS — M25511 Pain in right shoulder: Secondary | ICD-10-CM | POA: Diagnosis not present

## 2018-10-05 DIAGNOSIS — N3946 Mixed incontinence: Secondary | ICD-10-CM | POA: Diagnosis not present

## 2018-10-05 DIAGNOSIS — R278 Other lack of coordination: Secondary | ICD-10-CM | POA: Diagnosis not present

## 2018-10-07 DIAGNOSIS — R2681 Unsteadiness on feet: Secondary | ICD-10-CM | POA: Diagnosis not present

## 2018-10-07 DIAGNOSIS — N3946 Mixed incontinence: Secondary | ICD-10-CM | POA: Diagnosis not present

## 2018-10-07 DIAGNOSIS — Z9181 History of falling: Secondary | ICD-10-CM | POA: Diagnosis not present

## 2018-10-07 DIAGNOSIS — R278 Other lack of coordination: Secondary | ICD-10-CM | POA: Diagnosis not present

## 2018-10-07 DIAGNOSIS — M6281 Muscle weakness (generalized): Secondary | ICD-10-CM | POA: Diagnosis not present

## 2018-10-07 DIAGNOSIS — M25511 Pain in right shoulder: Secondary | ICD-10-CM | POA: Diagnosis not present

## 2018-10-08 DIAGNOSIS — M6281 Muscle weakness (generalized): Secondary | ICD-10-CM | POA: Diagnosis not present

## 2018-10-08 DIAGNOSIS — M25511 Pain in right shoulder: Secondary | ICD-10-CM | POA: Diagnosis not present

## 2018-10-08 DIAGNOSIS — Z9181 History of falling: Secondary | ICD-10-CM | POA: Diagnosis not present

## 2018-10-08 DIAGNOSIS — R278 Other lack of coordination: Secondary | ICD-10-CM | POA: Diagnosis not present

## 2018-10-08 DIAGNOSIS — R2681 Unsteadiness on feet: Secondary | ICD-10-CM | POA: Diagnosis not present

## 2018-10-08 DIAGNOSIS — N3946 Mixed incontinence: Secondary | ICD-10-CM | POA: Diagnosis not present

## 2018-10-09 DIAGNOSIS — Z9181 History of falling: Secondary | ICD-10-CM | POA: Diagnosis not present

## 2018-10-09 DIAGNOSIS — R278 Other lack of coordination: Secondary | ICD-10-CM | POA: Diagnosis not present

## 2018-10-09 DIAGNOSIS — N3946 Mixed incontinence: Secondary | ICD-10-CM | POA: Diagnosis not present

## 2018-10-09 DIAGNOSIS — M25511 Pain in right shoulder: Secondary | ICD-10-CM | POA: Diagnosis not present

## 2018-10-09 DIAGNOSIS — M6281 Muscle weakness (generalized): Secondary | ICD-10-CM | POA: Diagnosis not present

## 2018-10-09 DIAGNOSIS — R2681 Unsteadiness on feet: Secondary | ICD-10-CM | POA: Diagnosis not present

## 2018-10-12 DIAGNOSIS — M6281 Muscle weakness (generalized): Secondary | ICD-10-CM | POA: Diagnosis not present

## 2018-10-12 DIAGNOSIS — M25511 Pain in right shoulder: Secondary | ICD-10-CM | POA: Diagnosis not present

## 2018-10-12 DIAGNOSIS — R278 Other lack of coordination: Secondary | ICD-10-CM | POA: Diagnosis not present

## 2018-10-12 DIAGNOSIS — Z9181 History of falling: Secondary | ICD-10-CM | POA: Diagnosis not present

## 2018-10-12 DIAGNOSIS — N3946 Mixed incontinence: Secondary | ICD-10-CM | POA: Diagnosis not present

## 2018-10-12 DIAGNOSIS — R2681 Unsteadiness on feet: Secondary | ICD-10-CM | POA: Diagnosis not present

## 2018-10-14 DIAGNOSIS — M6281 Muscle weakness (generalized): Secondary | ICD-10-CM | POA: Diagnosis not present

## 2018-10-14 DIAGNOSIS — N3946 Mixed incontinence: Secondary | ICD-10-CM | POA: Diagnosis not present

## 2018-10-14 DIAGNOSIS — M25511 Pain in right shoulder: Secondary | ICD-10-CM | POA: Diagnosis not present

## 2018-10-14 DIAGNOSIS — Z9181 History of falling: Secondary | ICD-10-CM | POA: Diagnosis not present

## 2018-10-14 DIAGNOSIS — R278 Other lack of coordination: Secondary | ICD-10-CM | POA: Diagnosis not present

## 2018-10-14 DIAGNOSIS — R2681 Unsteadiness on feet: Secondary | ICD-10-CM | POA: Diagnosis not present

## 2018-10-15 DIAGNOSIS — M25511 Pain in right shoulder: Secondary | ICD-10-CM | POA: Diagnosis not present

## 2018-10-15 DIAGNOSIS — M6281 Muscle weakness (generalized): Secondary | ICD-10-CM | POA: Diagnosis not present

## 2018-10-15 DIAGNOSIS — Z9181 History of falling: Secondary | ICD-10-CM | POA: Diagnosis not present

## 2018-10-15 DIAGNOSIS — N3946 Mixed incontinence: Secondary | ICD-10-CM | POA: Diagnosis not present

## 2018-10-15 DIAGNOSIS — R278 Other lack of coordination: Secondary | ICD-10-CM | POA: Diagnosis not present

## 2018-10-15 DIAGNOSIS — R2681 Unsteadiness on feet: Secondary | ICD-10-CM | POA: Diagnosis not present

## 2018-10-19 DIAGNOSIS — Z9181 History of falling: Secondary | ICD-10-CM | POA: Diagnosis not present

## 2018-10-19 DIAGNOSIS — R278 Other lack of coordination: Secondary | ICD-10-CM | POA: Diagnosis not present

## 2018-10-19 DIAGNOSIS — R2681 Unsteadiness on feet: Secondary | ICD-10-CM | POA: Diagnosis not present

## 2018-10-19 DIAGNOSIS — M6281 Muscle weakness (generalized): Secondary | ICD-10-CM | POA: Diagnosis not present

## 2018-10-19 DIAGNOSIS — N3946 Mixed incontinence: Secondary | ICD-10-CM | POA: Diagnosis not present

## 2018-10-19 DIAGNOSIS — M25511 Pain in right shoulder: Secondary | ICD-10-CM | POA: Diagnosis not present

## 2018-10-21 DIAGNOSIS — M25511 Pain in right shoulder: Secondary | ICD-10-CM | POA: Diagnosis not present

## 2018-10-21 DIAGNOSIS — Z9181 History of falling: Secondary | ICD-10-CM | POA: Diagnosis not present

## 2018-10-21 DIAGNOSIS — R2681 Unsteadiness on feet: Secondary | ICD-10-CM | POA: Diagnosis not present

## 2018-10-21 DIAGNOSIS — N3946 Mixed incontinence: Secondary | ICD-10-CM | POA: Diagnosis not present

## 2018-10-21 DIAGNOSIS — R278 Other lack of coordination: Secondary | ICD-10-CM | POA: Diagnosis not present

## 2018-10-21 DIAGNOSIS — M6281 Muscle weakness (generalized): Secondary | ICD-10-CM | POA: Diagnosis not present

## 2018-10-22 DIAGNOSIS — R278 Other lack of coordination: Secondary | ICD-10-CM | POA: Diagnosis not present

## 2018-10-22 DIAGNOSIS — R2681 Unsteadiness on feet: Secondary | ICD-10-CM | POA: Diagnosis not present

## 2018-10-22 DIAGNOSIS — M6281 Muscle weakness (generalized): Secondary | ICD-10-CM | POA: Diagnosis not present

## 2018-10-22 DIAGNOSIS — Z9181 History of falling: Secondary | ICD-10-CM | POA: Diagnosis not present

## 2018-10-22 DIAGNOSIS — N3946 Mixed incontinence: Secondary | ICD-10-CM | POA: Diagnosis not present

## 2018-10-22 DIAGNOSIS — M25511 Pain in right shoulder: Secondary | ICD-10-CM | POA: Diagnosis not present

## 2018-10-25 DIAGNOSIS — N3946 Mixed incontinence: Secondary | ICD-10-CM | POA: Diagnosis not present

## 2018-10-25 DIAGNOSIS — R2681 Unsteadiness on feet: Secondary | ICD-10-CM | POA: Diagnosis not present

## 2018-10-25 DIAGNOSIS — Z9181 History of falling: Secondary | ICD-10-CM | POA: Diagnosis not present

## 2018-10-25 DIAGNOSIS — M6281 Muscle weakness (generalized): Secondary | ICD-10-CM | POA: Diagnosis not present

## 2018-10-25 DIAGNOSIS — R278 Other lack of coordination: Secondary | ICD-10-CM | POA: Diagnosis not present

## 2018-10-25 DIAGNOSIS — M25511 Pain in right shoulder: Secondary | ICD-10-CM | POA: Diagnosis not present

## 2018-10-26 DIAGNOSIS — N3946 Mixed incontinence: Secondary | ICD-10-CM | POA: Diagnosis not present

## 2018-10-26 DIAGNOSIS — M25511 Pain in right shoulder: Secondary | ICD-10-CM | POA: Diagnosis not present

## 2018-10-26 DIAGNOSIS — R278 Other lack of coordination: Secondary | ICD-10-CM | POA: Diagnosis not present

## 2018-10-26 DIAGNOSIS — Z9181 History of falling: Secondary | ICD-10-CM | POA: Diagnosis not present

## 2018-10-26 DIAGNOSIS — R2681 Unsteadiness on feet: Secondary | ICD-10-CM | POA: Diagnosis not present

## 2018-10-26 DIAGNOSIS — M6281 Muscle weakness (generalized): Secondary | ICD-10-CM | POA: Diagnosis not present

## 2018-10-27 DIAGNOSIS — M6281 Muscle weakness (generalized): Secondary | ICD-10-CM | POA: Diagnosis not present

## 2018-10-27 DIAGNOSIS — M25511 Pain in right shoulder: Secondary | ICD-10-CM | POA: Diagnosis not present

## 2018-10-27 DIAGNOSIS — R2681 Unsteadiness on feet: Secondary | ICD-10-CM | POA: Diagnosis not present

## 2018-10-27 DIAGNOSIS — Z9181 History of falling: Secondary | ICD-10-CM | POA: Diagnosis not present

## 2018-10-27 DIAGNOSIS — N3946 Mixed incontinence: Secondary | ICD-10-CM | POA: Diagnosis not present

## 2018-10-27 DIAGNOSIS — R278 Other lack of coordination: Secondary | ICD-10-CM | POA: Diagnosis not present

## 2018-10-28 DIAGNOSIS — M25511 Pain in right shoulder: Secondary | ICD-10-CM | POA: Diagnosis not present

## 2018-10-28 DIAGNOSIS — M6281 Muscle weakness (generalized): Secondary | ICD-10-CM | POA: Diagnosis not present

## 2018-10-28 DIAGNOSIS — Z9181 History of falling: Secondary | ICD-10-CM | POA: Diagnosis not present

## 2018-10-28 DIAGNOSIS — N3946 Mixed incontinence: Secondary | ICD-10-CM | POA: Diagnosis not present

## 2018-10-28 DIAGNOSIS — R2681 Unsteadiness on feet: Secondary | ICD-10-CM | POA: Diagnosis not present

## 2018-10-28 DIAGNOSIS — R278 Other lack of coordination: Secondary | ICD-10-CM | POA: Diagnosis not present

## 2018-10-29 DIAGNOSIS — Z9181 History of falling: Secondary | ICD-10-CM | POA: Diagnosis not present

## 2018-10-29 DIAGNOSIS — M25511 Pain in right shoulder: Secondary | ICD-10-CM | POA: Diagnosis not present

## 2018-10-29 DIAGNOSIS — R278 Other lack of coordination: Secondary | ICD-10-CM | POA: Diagnosis not present

## 2018-10-29 DIAGNOSIS — R2681 Unsteadiness on feet: Secondary | ICD-10-CM | POA: Diagnosis not present

## 2018-10-29 DIAGNOSIS — M6281 Muscle weakness (generalized): Secondary | ICD-10-CM | POA: Diagnosis not present

## 2018-10-29 DIAGNOSIS — N3946 Mixed incontinence: Secondary | ICD-10-CM | POA: Diagnosis not present

## 2018-10-30 DIAGNOSIS — R2681 Unsteadiness on feet: Secondary | ICD-10-CM | POA: Diagnosis not present

## 2018-10-30 DIAGNOSIS — R278 Other lack of coordination: Secondary | ICD-10-CM | POA: Diagnosis not present

## 2018-10-30 DIAGNOSIS — M6281 Muscle weakness (generalized): Secondary | ICD-10-CM | POA: Diagnosis not present

## 2018-10-30 DIAGNOSIS — Z9181 History of falling: Secondary | ICD-10-CM | POA: Diagnosis not present

## 2018-10-30 DIAGNOSIS — M25511 Pain in right shoulder: Secondary | ICD-10-CM | POA: Diagnosis not present

## 2018-10-30 DIAGNOSIS — N3946 Mixed incontinence: Secondary | ICD-10-CM | POA: Diagnosis not present

## 2018-11-03 DIAGNOSIS — N3946 Mixed incontinence: Secondary | ICD-10-CM | POA: Diagnosis not present

## 2018-11-03 DIAGNOSIS — M6281 Muscle weakness (generalized): Secondary | ICD-10-CM | POA: Diagnosis not present

## 2018-11-03 DIAGNOSIS — R2681 Unsteadiness on feet: Secondary | ICD-10-CM | POA: Diagnosis not present

## 2018-11-03 DIAGNOSIS — M25511 Pain in right shoulder: Secondary | ICD-10-CM | POA: Diagnosis not present

## 2018-11-03 DIAGNOSIS — Z9181 History of falling: Secondary | ICD-10-CM | POA: Diagnosis not present

## 2018-11-03 DIAGNOSIS — R278 Other lack of coordination: Secondary | ICD-10-CM | POA: Diagnosis not present

## 2018-11-04 ENCOUNTER — Other Ambulatory Visit: Payer: Self-pay | Admitting: Cardiology

## 2018-11-04 DIAGNOSIS — N3946 Mixed incontinence: Secondary | ICD-10-CM | POA: Diagnosis not present

## 2018-11-04 DIAGNOSIS — R278 Other lack of coordination: Secondary | ICD-10-CM | POA: Diagnosis not present

## 2018-11-04 DIAGNOSIS — M6281 Muscle weakness (generalized): Secondary | ICD-10-CM | POA: Diagnosis not present

## 2018-11-04 DIAGNOSIS — Z9181 History of falling: Secondary | ICD-10-CM | POA: Diagnosis not present

## 2018-11-04 DIAGNOSIS — R2681 Unsteadiness on feet: Secondary | ICD-10-CM | POA: Diagnosis not present

## 2018-11-04 DIAGNOSIS — M25511 Pain in right shoulder: Secondary | ICD-10-CM | POA: Diagnosis not present

## 2018-11-05 DIAGNOSIS — N3946 Mixed incontinence: Secondary | ICD-10-CM | POA: Diagnosis not present

## 2018-11-05 DIAGNOSIS — M6281 Muscle weakness (generalized): Secondary | ICD-10-CM | POA: Diagnosis not present

## 2018-11-05 DIAGNOSIS — R2681 Unsteadiness on feet: Secondary | ICD-10-CM | POA: Diagnosis not present

## 2018-11-05 DIAGNOSIS — R278 Other lack of coordination: Secondary | ICD-10-CM | POA: Diagnosis not present

## 2018-11-05 DIAGNOSIS — Z9181 History of falling: Secondary | ICD-10-CM | POA: Diagnosis not present

## 2018-11-05 DIAGNOSIS — M25511 Pain in right shoulder: Secondary | ICD-10-CM | POA: Diagnosis not present

## 2018-11-06 DIAGNOSIS — M25511 Pain in right shoulder: Secondary | ICD-10-CM | POA: Diagnosis not present

## 2018-11-06 DIAGNOSIS — R2681 Unsteadiness on feet: Secondary | ICD-10-CM | POA: Diagnosis not present

## 2018-11-06 DIAGNOSIS — R278 Other lack of coordination: Secondary | ICD-10-CM | POA: Diagnosis not present

## 2018-11-06 DIAGNOSIS — M6281 Muscle weakness (generalized): Secondary | ICD-10-CM | POA: Diagnosis not present

## 2018-11-06 DIAGNOSIS — Z9181 History of falling: Secondary | ICD-10-CM | POA: Diagnosis not present

## 2018-11-06 DIAGNOSIS — N3946 Mixed incontinence: Secondary | ICD-10-CM | POA: Diagnosis not present

## 2018-11-09 ENCOUNTER — Telehealth: Payer: Self-pay | Admitting: Cardiology

## 2018-11-09 ENCOUNTER — Other Ambulatory Visit: Payer: Self-pay

## 2018-11-09 DIAGNOSIS — N3946 Mixed incontinence: Secondary | ICD-10-CM | POA: Diagnosis not present

## 2018-11-09 DIAGNOSIS — R278 Other lack of coordination: Secondary | ICD-10-CM | POA: Diagnosis not present

## 2018-11-09 DIAGNOSIS — M6281 Muscle weakness (generalized): Secondary | ICD-10-CM | POA: Diagnosis not present

## 2018-11-09 DIAGNOSIS — R2681 Unsteadiness on feet: Secondary | ICD-10-CM | POA: Diagnosis not present

## 2018-11-09 DIAGNOSIS — Z9181 History of falling: Secondary | ICD-10-CM | POA: Diagnosis not present

## 2018-11-09 DIAGNOSIS — M25511 Pain in right shoulder: Secondary | ICD-10-CM | POA: Diagnosis not present

## 2018-11-09 MED ORDER — CLOPIDOGREL BISULFATE 75 MG PO TABS: 75.0000 mg | ORAL_TABLET | Freq: Every day | ORAL | 0 refills | Status: DC

## 2018-11-09 MED ORDER — FUROSEMIDE 40 MG PO TABS: 40.0000 mg | ORAL_TABLET | Freq: Every day | ORAL | 0 refills | Status: DC

## 2018-11-09 NOTE — Telephone Encounter (Signed)
 *  STAT* If patient is at the pharmacy, call can be transferred to refill team.   1. Which medications need to be refilled? (please list name of each medication and dose if known)  furosemide (LASIX) 40 MG tablet clopidogrel (PLAVIX) 75 MG tablet  2. Which pharmacy/location (including street and city if local pharmacy) is medication to be sent to? [facility]  3. Do they need a 30 day or 90 day supply?90 if possible  Patient's daughter is going out of town and needs to pick up meds by tomorrow

## 2018-11-10 DIAGNOSIS — M25511 Pain in right shoulder: Secondary | ICD-10-CM | POA: Diagnosis not present

## 2018-11-10 DIAGNOSIS — Z9181 History of falling: Secondary | ICD-10-CM | POA: Diagnosis not present

## 2018-11-10 DIAGNOSIS — R278 Other lack of coordination: Secondary | ICD-10-CM | POA: Diagnosis not present

## 2018-11-10 DIAGNOSIS — R2681 Unsteadiness on feet: Secondary | ICD-10-CM | POA: Diagnosis not present

## 2018-11-10 DIAGNOSIS — N3946 Mixed incontinence: Secondary | ICD-10-CM | POA: Diagnosis not present

## 2018-11-10 DIAGNOSIS — M6281 Muscle weakness (generalized): Secondary | ICD-10-CM | POA: Diagnosis not present

## 2018-11-11 DIAGNOSIS — R2681 Unsteadiness on feet: Secondary | ICD-10-CM | POA: Diagnosis not present

## 2018-11-11 DIAGNOSIS — R278 Other lack of coordination: Secondary | ICD-10-CM | POA: Diagnosis not present

## 2018-11-11 DIAGNOSIS — Z9181 History of falling: Secondary | ICD-10-CM | POA: Diagnosis not present

## 2018-11-11 DIAGNOSIS — N3946 Mixed incontinence: Secondary | ICD-10-CM | POA: Diagnosis not present

## 2018-11-11 DIAGNOSIS — M6281 Muscle weakness (generalized): Secondary | ICD-10-CM | POA: Diagnosis not present

## 2018-11-11 DIAGNOSIS — M25511 Pain in right shoulder: Secondary | ICD-10-CM | POA: Diagnosis not present

## 2018-11-12 DIAGNOSIS — M25511 Pain in right shoulder: Secondary | ICD-10-CM | POA: Diagnosis not present

## 2018-11-12 DIAGNOSIS — R278 Other lack of coordination: Secondary | ICD-10-CM | POA: Diagnosis not present

## 2018-11-12 DIAGNOSIS — M6281 Muscle weakness (generalized): Secondary | ICD-10-CM | POA: Diagnosis not present

## 2018-11-12 DIAGNOSIS — N3946 Mixed incontinence: Secondary | ICD-10-CM | POA: Diagnosis not present

## 2018-11-12 DIAGNOSIS — Z9181 History of falling: Secondary | ICD-10-CM | POA: Diagnosis not present

## 2018-11-12 DIAGNOSIS — R2681 Unsteadiness on feet: Secondary | ICD-10-CM | POA: Diagnosis not present

## 2018-11-13 DIAGNOSIS — M25511 Pain in right shoulder: Secondary | ICD-10-CM | POA: Diagnosis not present

## 2018-11-13 DIAGNOSIS — R2681 Unsteadiness on feet: Secondary | ICD-10-CM | POA: Diagnosis not present

## 2018-11-13 DIAGNOSIS — N3946 Mixed incontinence: Secondary | ICD-10-CM | POA: Diagnosis not present

## 2018-11-13 DIAGNOSIS — M6281 Muscle weakness (generalized): Secondary | ICD-10-CM | POA: Diagnosis not present

## 2018-11-13 DIAGNOSIS — R278 Other lack of coordination: Secondary | ICD-10-CM | POA: Diagnosis not present

## 2018-11-13 DIAGNOSIS — Z9181 History of falling: Secondary | ICD-10-CM | POA: Diagnosis not present

## 2018-11-16 DIAGNOSIS — M25511 Pain in right shoulder: Secondary | ICD-10-CM | POA: Diagnosis not present

## 2018-11-16 DIAGNOSIS — Z9181 History of falling: Secondary | ICD-10-CM | POA: Diagnosis not present

## 2018-11-16 DIAGNOSIS — R2681 Unsteadiness on feet: Secondary | ICD-10-CM | POA: Diagnosis not present

## 2018-11-16 DIAGNOSIS — M6281 Muscle weakness (generalized): Secondary | ICD-10-CM | POA: Diagnosis not present

## 2018-11-16 DIAGNOSIS — N3946 Mixed incontinence: Secondary | ICD-10-CM | POA: Diagnosis not present

## 2018-11-16 DIAGNOSIS — R278 Other lack of coordination: Secondary | ICD-10-CM | POA: Diagnosis not present

## 2018-11-18 DIAGNOSIS — R278 Other lack of coordination: Secondary | ICD-10-CM | POA: Diagnosis not present

## 2018-11-18 DIAGNOSIS — N3946 Mixed incontinence: Secondary | ICD-10-CM | POA: Diagnosis not present

## 2018-11-18 DIAGNOSIS — Z9181 History of falling: Secondary | ICD-10-CM | POA: Diagnosis not present

## 2018-11-18 DIAGNOSIS — R2681 Unsteadiness on feet: Secondary | ICD-10-CM | POA: Diagnosis not present

## 2018-11-18 DIAGNOSIS — M25511 Pain in right shoulder: Secondary | ICD-10-CM | POA: Diagnosis not present

## 2018-11-18 DIAGNOSIS — M6281 Muscle weakness (generalized): Secondary | ICD-10-CM | POA: Diagnosis not present

## 2018-11-20 DIAGNOSIS — R2681 Unsteadiness on feet: Secondary | ICD-10-CM | POA: Diagnosis not present

## 2018-11-20 DIAGNOSIS — R278 Other lack of coordination: Secondary | ICD-10-CM | POA: Diagnosis not present

## 2018-11-20 DIAGNOSIS — Z9181 History of falling: Secondary | ICD-10-CM | POA: Diagnosis not present

## 2018-11-20 DIAGNOSIS — N3946 Mixed incontinence: Secondary | ICD-10-CM | POA: Diagnosis not present

## 2018-11-20 DIAGNOSIS — M25511 Pain in right shoulder: Secondary | ICD-10-CM | POA: Diagnosis not present

## 2018-11-20 DIAGNOSIS — M6281 Muscle weakness (generalized): Secondary | ICD-10-CM | POA: Diagnosis not present

## 2018-11-23 DIAGNOSIS — Z9181 History of falling: Secondary | ICD-10-CM | POA: Diagnosis not present

## 2018-11-23 DIAGNOSIS — M25511 Pain in right shoulder: Secondary | ICD-10-CM | POA: Diagnosis not present

## 2018-11-23 DIAGNOSIS — M6281 Muscle weakness (generalized): Secondary | ICD-10-CM | POA: Diagnosis not present

## 2018-11-23 DIAGNOSIS — R2681 Unsteadiness on feet: Secondary | ICD-10-CM | POA: Diagnosis not present

## 2018-11-23 DIAGNOSIS — N3946 Mixed incontinence: Secondary | ICD-10-CM | POA: Diagnosis not present

## 2018-11-23 DIAGNOSIS — R278 Other lack of coordination: Secondary | ICD-10-CM | POA: Diagnosis not present

## 2018-11-24 DIAGNOSIS — N3946 Mixed incontinence: Secondary | ICD-10-CM | POA: Diagnosis not present

## 2018-11-24 DIAGNOSIS — M25511 Pain in right shoulder: Secondary | ICD-10-CM | POA: Diagnosis not present

## 2018-11-24 DIAGNOSIS — R2681 Unsteadiness on feet: Secondary | ICD-10-CM | POA: Diagnosis not present

## 2018-11-24 DIAGNOSIS — M6281 Muscle weakness (generalized): Secondary | ICD-10-CM | POA: Diagnosis not present

## 2018-11-24 DIAGNOSIS — Z9181 History of falling: Secondary | ICD-10-CM | POA: Diagnosis not present

## 2018-11-24 DIAGNOSIS — R278 Other lack of coordination: Secondary | ICD-10-CM | POA: Diagnosis not present

## 2018-11-26 DIAGNOSIS — N3946 Mixed incontinence: Secondary | ICD-10-CM | POA: Diagnosis not present

## 2018-11-26 DIAGNOSIS — M6281 Muscle weakness (generalized): Secondary | ICD-10-CM | POA: Diagnosis not present

## 2018-11-26 DIAGNOSIS — R278 Other lack of coordination: Secondary | ICD-10-CM | POA: Diagnosis not present

## 2018-11-26 DIAGNOSIS — Z9181 History of falling: Secondary | ICD-10-CM | POA: Diagnosis not present

## 2018-11-26 DIAGNOSIS — M25511 Pain in right shoulder: Secondary | ICD-10-CM | POA: Diagnosis not present

## 2018-11-26 DIAGNOSIS — R2681 Unsteadiness on feet: Secondary | ICD-10-CM | POA: Diagnosis not present

## 2018-11-27 DIAGNOSIS — N3946 Mixed incontinence: Secondary | ICD-10-CM | POA: Diagnosis not present

## 2018-11-27 DIAGNOSIS — Z9181 History of falling: Secondary | ICD-10-CM | POA: Diagnosis not present

## 2018-11-27 DIAGNOSIS — R278 Other lack of coordination: Secondary | ICD-10-CM | POA: Diagnosis not present

## 2018-11-27 DIAGNOSIS — M6281 Muscle weakness (generalized): Secondary | ICD-10-CM | POA: Diagnosis not present

## 2018-11-27 DIAGNOSIS — M25511 Pain in right shoulder: Secondary | ICD-10-CM | POA: Diagnosis not present

## 2018-11-27 DIAGNOSIS — R2681 Unsteadiness on feet: Secondary | ICD-10-CM | POA: Diagnosis not present

## 2018-11-30 DIAGNOSIS — R2681 Unsteadiness on feet: Secondary | ICD-10-CM | POA: Diagnosis not present

## 2018-11-30 DIAGNOSIS — M25511 Pain in right shoulder: Secondary | ICD-10-CM | POA: Diagnosis not present

## 2018-11-30 DIAGNOSIS — M6281 Muscle weakness (generalized): Secondary | ICD-10-CM | POA: Diagnosis not present

## 2018-11-30 DIAGNOSIS — N3946 Mixed incontinence: Secondary | ICD-10-CM | POA: Diagnosis not present

## 2018-11-30 DIAGNOSIS — R278 Other lack of coordination: Secondary | ICD-10-CM | POA: Diagnosis not present

## 2018-11-30 DIAGNOSIS — Z9181 History of falling: Secondary | ICD-10-CM | POA: Diagnosis not present

## 2018-12-01 DIAGNOSIS — M25511 Pain in right shoulder: Secondary | ICD-10-CM | POA: Diagnosis not present

## 2018-12-01 DIAGNOSIS — R2681 Unsteadiness on feet: Secondary | ICD-10-CM | POA: Diagnosis not present

## 2018-12-01 DIAGNOSIS — N3946 Mixed incontinence: Secondary | ICD-10-CM | POA: Diagnosis not present

## 2018-12-01 DIAGNOSIS — M6281 Muscle weakness (generalized): Secondary | ICD-10-CM | POA: Diagnosis not present

## 2018-12-01 DIAGNOSIS — R278 Other lack of coordination: Secondary | ICD-10-CM | POA: Diagnosis not present

## 2018-12-01 DIAGNOSIS — Z9181 History of falling: Secondary | ICD-10-CM | POA: Diagnosis not present

## 2018-12-03 DIAGNOSIS — R2681 Unsteadiness on feet: Secondary | ICD-10-CM | POA: Diagnosis not present

## 2018-12-03 DIAGNOSIS — M25511 Pain in right shoulder: Secondary | ICD-10-CM | POA: Diagnosis not present

## 2018-12-03 DIAGNOSIS — R278 Other lack of coordination: Secondary | ICD-10-CM | POA: Diagnosis not present

## 2018-12-03 DIAGNOSIS — Z9181 History of falling: Secondary | ICD-10-CM | POA: Diagnosis not present

## 2018-12-03 DIAGNOSIS — M6281 Muscle weakness (generalized): Secondary | ICD-10-CM | POA: Diagnosis not present

## 2018-12-03 DIAGNOSIS — N3946 Mixed incontinence: Secondary | ICD-10-CM | POA: Diagnosis not present

## 2018-12-04 DIAGNOSIS — R2681 Unsteadiness on feet: Secondary | ICD-10-CM | POA: Diagnosis not present

## 2018-12-04 DIAGNOSIS — N3946 Mixed incontinence: Secondary | ICD-10-CM | POA: Diagnosis not present

## 2018-12-04 DIAGNOSIS — R278 Other lack of coordination: Secondary | ICD-10-CM | POA: Diagnosis not present

## 2018-12-04 DIAGNOSIS — M6281 Muscle weakness (generalized): Secondary | ICD-10-CM | POA: Diagnosis not present

## 2018-12-04 DIAGNOSIS — Z9181 History of falling: Secondary | ICD-10-CM | POA: Diagnosis not present

## 2018-12-04 DIAGNOSIS — M25511 Pain in right shoulder: Secondary | ICD-10-CM | POA: Diagnosis not present

## 2018-12-07 ENCOUNTER — Other Ambulatory Visit: Payer: Self-pay

## 2018-12-07 DIAGNOSIS — N3946 Mixed incontinence: Secondary | ICD-10-CM | POA: Diagnosis not present

## 2018-12-07 DIAGNOSIS — M6281 Muscle weakness (generalized): Secondary | ICD-10-CM | POA: Diagnosis not present

## 2018-12-07 DIAGNOSIS — R278 Other lack of coordination: Secondary | ICD-10-CM | POA: Diagnosis not present

## 2018-12-07 DIAGNOSIS — R2681 Unsteadiness on feet: Secondary | ICD-10-CM | POA: Diagnosis not present

## 2018-12-07 DIAGNOSIS — Z9181 History of falling: Secondary | ICD-10-CM | POA: Diagnosis not present

## 2018-12-07 DIAGNOSIS — M25511 Pain in right shoulder: Secondary | ICD-10-CM | POA: Diagnosis not present

## 2018-12-07 MED ORDER — CARVEDILOL 3.125 MG PO TABS: ORAL_TABLET | ORAL | 1 refills | Status: DC

## 2018-12-08 DIAGNOSIS — M6281 Muscle weakness (generalized): Secondary | ICD-10-CM | POA: Diagnosis not present

## 2018-12-08 DIAGNOSIS — Z9181 History of falling: Secondary | ICD-10-CM | POA: Diagnosis not present

## 2018-12-08 DIAGNOSIS — R278 Other lack of coordination: Secondary | ICD-10-CM | POA: Diagnosis not present

## 2018-12-08 DIAGNOSIS — M25511 Pain in right shoulder: Secondary | ICD-10-CM | POA: Diagnosis not present

## 2018-12-08 DIAGNOSIS — R2681 Unsteadiness on feet: Secondary | ICD-10-CM | POA: Diagnosis not present

## 2018-12-08 DIAGNOSIS — N3946 Mixed incontinence: Secondary | ICD-10-CM | POA: Diagnosis not present

## 2018-12-10 DIAGNOSIS — M25511 Pain in right shoulder: Secondary | ICD-10-CM | POA: Diagnosis not present

## 2018-12-10 DIAGNOSIS — M6281 Muscle weakness (generalized): Secondary | ICD-10-CM | POA: Diagnosis not present

## 2018-12-10 DIAGNOSIS — N3946 Mixed incontinence: Secondary | ICD-10-CM | POA: Diagnosis not present

## 2018-12-10 DIAGNOSIS — Z9181 History of falling: Secondary | ICD-10-CM | POA: Diagnosis not present

## 2018-12-10 DIAGNOSIS — R278 Other lack of coordination: Secondary | ICD-10-CM | POA: Diagnosis not present

## 2018-12-10 DIAGNOSIS — R2681 Unsteadiness on feet: Secondary | ICD-10-CM | POA: Diagnosis not present

## 2018-12-11 DIAGNOSIS — M6281 Muscle weakness (generalized): Secondary | ICD-10-CM | POA: Diagnosis not present

## 2018-12-11 DIAGNOSIS — Z9181 History of falling: Secondary | ICD-10-CM | POA: Diagnosis not present

## 2018-12-11 DIAGNOSIS — R2681 Unsteadiness on feet: Secondary | ICD-10-CM | POA: Diagnosis not present

## 2018-12-11 DIAGNOSIS — R278 Other lack of coordination: Secondary | ICD-10-CM | POA: Diagnosis not present

## 2018-12-11 DIAGNOSIS — N3946 Mixed incontinence: Secondary | ICD-10-CM | POA: Diagnosis not present

## 2018-12-11 DIAGNOSIS — M25511 Pain in right shoulder: Secondary | ICD-10-CM | POA: Diagnosis not present

## 2018-12-14 DIAGNOSIS — R2681 Unsteadiness on feet: Secondary | ICD-10-CM | POA: Diagnosis not present

## 2018-12-14 DIAGNOSIS — M6281 Muscle weakness (generalized): Secondary | ICD-10-CM | POA: Diagnosis not present

## 2018-12-14 DIAGNOSIS — Z9181 History of falling: Secondary | ICD-10-CM | POA: Diagnosis not present

## 2018-12-14 DIAGNOSIS — M25511 Pain in right shoulder: Secondary | ICD-10-CM | POA: Diagnosis not present

## 2018-12-14 DIAGNOSIS — N3946 Mixed incontinence: Secondary | ICD-10-CM | POA: Diagnosis not present

## 2018-12-14 DIAGNOSIS — R278 Other lack of coordination: Secondary | ICD-10-CM | POA: Diagnosis not present

## 2018-12-15 DIAGNOSIS — R2681 Unsteadiness on feet: Secondary | ICD-10-CM | POA: Diagnosis not present

## 2018-12-15 DIAGNOSIS — Z9181 History of falling: Secondary | ICD-10-CM | POA: Diagnosis not present

## 2018-12-15 DIAGNOSIS — N3946 Mixed incontinence: Secondary | ICD-10-CM | POA: Diagnosis not present

## 2018-12-15 DIAGNOSIS — M6281 Muscle weakness (generalized): Secondary | ICD-10-CM | POA: Diagnosis not present

## 2018-12-15 DIAGNOSIS — M25511 Pain in right shoulder: Secondary | ICD-10-CM | POA: Diagnosis not present

## 2018-12-15 DIAGNOSIS — R278 Other lack of coordination: Secondary | ICD-10-CM | POA: Diagnosis not present

## 2018-12-17 ENCOUNTER — Telehealth: Payer: Self-pay | Admitting: Cardiology

## 2018-12-17 DIAGNOSIS — M6281 Muscle weakness (generalized): Secondary | ICD-10-CM | POA: Diagnosis not present

## 2018-12-17 DIAGNOSIS — Z9181 History of falling: Secondary | ICD-10-CM | POA: Diagnosis not present

## 2018-12-17 DIAGNOSIS — R2681 Unsteadiness on feet: Secondary | ICD-10-CM | POA: Diagnosis not present

## 2018-12-17 DIAGNOSIS — R278 Other lack of coordination: Secondary | ICD-10-CM | POA: Diagnosis not present

## 2018-12-17 DIAGNOSIS — N3946 Mixed incontinence: Secondary | ICD-10-CM | POA: Diagnosis not present

## 2018-12-17 DIAGNOSIS — M25511 Pain in right shoulder: Secondary | ICD-10-CM | POA: Diagnosis not present

## 2018-12-17 NOTE — Telephone Encounter (Signed)
RN AWARE appt switch to virtual visit 4/20 --doxy daughter will be using her smartphone

## 2018-12-17 NOTE — Telephone Encounter (Signed)
Spoke with patient's daughter who confirmed all demographics. Patient is actively using My Chart.

## 2018-12-18 DIAGNOSIS — M25511 Pain in right shoulder: Secondary | ICD-10-CM | POA: Diagnosis not present

## 2018-12-18 DIAGNOSIS — R2681 Unsteadiness on feet: Secondary | ICD-10-CM | POA: Diagnosis not present

## 2018-12-18 DIAGNOSIS — R278 Other lack of coordination: Secondary | ICD-10-CM | POA: Diagnosis not present

## 2018-12-18 DIAGNOSIS — N3946 Mixed incontinence: Secondary | ICD-10-CM | POA: Diagnosis not present

## 2018-12-18 DIAGNOSIS — M6281 Muscle weakness (generalized): Secondary | ICD-10-CM | POA: Diagnosis not present

## 2018-12-18 DIAGNOSIS — Z9181 History of falling: Secondary | ICD-10-CM | POA: Diagnosis not present

## 2018-12-21 ENCOUNTER — Encounter: Payer: Self-pay | Admitting: Cardiology

## 2018-12-21 ENCOUNTER — Telehealth (INDEPENDENT_AMBULATORY_CARE_PROVIDER_SITE_OTHER): Payer: Medicare Other | Admitting: Cardiology

## 2018-12-21 ENCOUNTER — Telehealth: Payer: Self-pay | Admitting: Cardiology

## 2018-12-21 ENCOUNTER — Telehealth: Payer: Medicare Other | Admitting: Internal Medicine

## 2018-12-21 ENCOUNTER — Ambulatory Visit: Payer: Medicare Other | Admitting: Cardiology

## 2018-12-21 VITALS — BP 114/78 | HR 82 | Temp 96.4°F | Wt 107.2 lb

## 2018-12-21 DIAGNOSIS — I2510 Atherosclerotic heart disease of native coronary artery without angina pectoris: Secondary | ICD-10-CM | POA: Diagnosis not present

## 2018-12-21 DIAGNOSIS — Z9861 Coronary angioplasty status: Secondary | ICD-10-CM

## 2018-12-21 DIAGNOSIS — Z9181 History of falling: Secondary | ICD-10-CM | POA: Diagnosis not present

## 2018-12-21 DIAGNOSIS — R0609 Other forms of dyspnea: Secondary | ICD-10-CM

## 2018-12-21 DIAGNOSIS — R2681 Unsteadiness on feet: Secondary | ICD-10-CM | POA: Diagnosis not present

## 2018-12-21 DIAGNOSIS — E785 Hyperlipidemia, unspecified: Secondary | ICD-10-CM

## 2018-12-21 DIAGNOSIS — I255 Ischemic cardiomyopathy: Secondary | ICD-10-CM

## 2018-12-21 DIAGNOSIS — J301 Allergic rhinitis due to pollen: Secondary | ICD-10-CM

## 2018-12-21 DIAGNOSIS — N3946 Mixed incontinence: Secondary | ICD-10-CM | POA: Diagnosis not present

## 2018-12-21 DIAGNOSIS — R278 Other lack of coordination: Secondary | ICD-10-CM | POA: Diagnosis not present

## 2018-12-21 DIAGNOSIS — M25511 Pain in right shoulder: Secondary | ICD-10-CM | POA: Diagnosis not present

## 2018-12-21 DIAGNOSIS — M6281 Muscle weakness (generalized): Secondary | ICD-10-CM | POA: Diagnosis not present

## 2018-12-21 DIAGNOSIS — I5042 Chronic combined systolic (congestive) and diastolic (congestive) heart failure: Secondary | ICD-10-CM

## 2018-12-21 MED ORDER — LOSARTAN POTASSIUM 25 MG PO TABS: 25.0000 mg | ORAL_TABLET | Freq: Every day | ORAL | 3 refills | Status: DC

## 2018-12-21 MED ORDER — MELATONIN 10 MG PO TABS: ORAL_TABLET | ORAL | Status: AC

## 2018-12-21 NOTE — Progress Notes (Signed)
Virtual Visit via Video Note   This visit type was conducted due to national recommendations for restrictions regarding the COVID-19 Pandemic (e.g. social distancing) in an effort to limit this patient's exposure and mitigate transmission in our community.  Due to her co-morbid illnesses, this patient is at least at moderate risk for complications without adequate follow up.  This format is felt to be most appropriate for this patient at this time.  All issues noted in this document were discussed and addressed.  A limited physical exam was performed with this format.  Please refer to the patient's chart for her consent to telehealth for [facility].   Patient has given verbal permission to conduct this visit via virtual appointment and to bill insurance 12/21/2018 5:27 PM     Evaluation Performed:  Follow-up visit  Date:  12/21/2018   ID:  Leah Trujillo, Leah Trujillo [birth year], MRN [id number]  Patient Location: Home Provider Location: Office - daughter's house (from [place])  PCP:  Prince Solian, MD  Cardiologist:  Glenetta Hew, MD Electrophysiologist:  None   Chief Complaint:  Delayed annual f/u - CAD-PCI  History of Present Illness:    Leah Trujillo is a 85 y.o. female with PMH notable for CAD-PCI to LAD in October 2017 who presents via audio/video conferencing for a telehealth visit today for what amounts to be delayed annual follow-up. Initial consultation on 06/24/2016 with complaints of worsening exertional dyspnea. She was short of breath walking across her neck. She had LBBB on EKG with frequent PACs. Chest x-ray showed pulmonary edema and echocardiogram showed moderate-severe dysfunction with EF 35-40% and diffuse HK. Moderate pulmonary hypertension. --> Cardiac Revealed significant proximal LAD disease (FFR positive) -> DES PCI. Severely elevated LVEDP -suggesting standing diastolic heart failure  Leah Trujillo was last seen in December 2018, was doing  quite well.  Mostly noticing sinus pressure and headache as well as some exertional dyspnea.  Interval History:  Leah Trujillo is being seen today via video at her daughter's house.  She is currently still in assisted living.  Overall doing relatively well, but is starting to get a little bit of almost depression from the social distancing.  She is eating her meals in her room is now and therefore has not been eating as much and is lost a lot of weight.  Just low appetite. She does note some exertional dyspnea but no chest pain or pressure.  Doing therapy -- & occasionally with exertion she has shortness of breath (PT after fall in April 2019 - slipped off of a step going down out of her daughter's house -- R pelvic & Humerus fractrures).\  Notes positional dizziness.  Mostly in the morning.  Cardiovascular ROS: positive for - dyspnea on exertion negative for - chest pain, edema, irregular heartbeat, loss of consciousness, orthopnea, palpitations, paroxysmal nocturnal dyspnea, rapid heart rate, shortness of breath or Syncope/near syncope, TIA shows amaurosis fugax.  Melena, hematochezia, hematuria epistaxis.  The patient does not have symptoms concerning for COVID-19 infection (fever, chills, cough, or new shortness of breath).  The patient is practicing social distancing.  ROS:  Please see the history of present illness.    Review of Systems  Constitutional: Positive for malaise/fatigue and weight loss (less appetitie). Negative for chills and fever.  HENT: Negative for congestion and nosebleeds.   Respiratory: Positive for shortness of breath (with exertion). Negative for cough (once & a while - off & on) and wheezing.   Gastrointestinal:  Negative for abdominal pain, blood in stool, diarrhea (loose stools off & on), heartburn, melena, nausea and vomiting.  Genitourinary: Negative for hematuria.  Musculoskeletal: Positive for falls (last April) and joint pain (R arm / shoulder & pelvis).    Neurological: Positive for dizziness (occasional positional).       Poor balance.  Currently undergoing PT/OT  Psychiatric/Behavioral: Positive for memory loss. Negative for depression. The patient is not nervous/anxious and does not have insomnia (takes melatonin).   All other systems reviewed and are negative.   Past Medical History:  Diagnosis Date   CAD S/P percutaneous coronary angioplasty 07/09/2016   a. Cath: LM nl, pLAD 75% (FFR 0.79 -> 2.25 x 12 Synergy DES), mLAD 55%, ost D1 50%, LCX nl, OM1nl, RCA large, min irregs, EF 35%, EDP 25-66mmHg.   Chronic radicular pain of lower back    Chronic systolic CHF (congestive heart failure) ([place])    a. 05/2016 Echo: EF 35-40%, diff HK, Gr1 DD.   Complication of anesthesia    "takes alot to put me down" (07/09/2016)   DJD (degenerative joint disease), thoracolumbar    Chronic back pain but also neck and knees   Dysthymia    Well-controlled on Celexa   Family history of adverse reaction to anesthesia    "takes alot to put my family down" (07/09/2016)   GERD (gastroesophageal reflux disease)    Headache    "pretty often" (07/09/2016)   High cholesterol    History of kidney stones    Hypertension    Hypothyroidism (acquired)    On thyroid replacement   Impaired fasting glucose    He will A1c 5.7 from August 2017   Ischemic cardiomyopathy; With some non-ischemic component    a. 05/2016 Echo: EF 35-40%.; PCI of pLAD with DES.   LBBB (left bundle branch block)    OAB (overactive bladder)    Past Surgical History:  Procedure Laterality Date   CARDIAC CATHETERIZATION N/A 07/09/2016   Procedure: Left Heart Cath and Coronary Angiography;  Surgeon: Leonie Man, MD;  Location: [facility];  Service: Cardiovascular: LM nl, pLAD 75% (FFR 0.79 -> 2.25 x 12 Synergy DES), mLAD 55%, ost D1 50%, LCX nl, OM1nl, RCA large, min irregs, EF 35%, EDP 25-60mmHg.   CARDIAC CATHETERIZATION N/A 07/09/2016   Procedure: Intravascular  Pressure Wire/FFR Study;  Surgeon: Leonie Man, MD;  Location: [facility];  Service: Cardiovascular: pLAD R: 0.79   CARDIAC CATHETERIZATION N/A 07/09/2016   Procedure: Coronary Stent Intervention;  Surgeon: Leonie Man, MD;  Location: [facility];  Service: Cardiovascular: pLAD (@D1 ) PCI with SYNERGY DES 2.25 X 12 (post-dialted to 2.6 mm)   CATARACT EXTRACTION W/ INTRAOCULAR LENS  IMPLANT, BILATERAL Bilateral    CORNEAL TRANSPLANT  1970-2013   "3 in right eye; 1 in left eye"   CORONARY ANGIOPLASTY WITH STENT PLACEMENT  07/09/2016   EYE SURGERY     KIDNEY STONE SURGERY  1980   "cut me 1/2 way open"   TONSILLECTOMY     TRANSTHORACIC ECHOCARDIOGRAM  05/17/2016   Normal LV size. Moderate-severely reduced EF of 35-40% with diffuse HK. Only GR 1 DD noted.  Abdomen normal, dyssynchronous septal motion secondary to LBBB. Moderate TR with PA pressures estimated at 44 mmHg. (Moderate pulmonary hypertension.   TRANSTHORACIC ECHOCARDIOGRAM  10/15/2016   Post PCI: EF 45-50%. Still has some mild mid to apical anteroseptal hypokinesis. GR 1 DD. Mild septal dyssynergy related to the. Mild MR,  mild TR. Mild LAE and mildly elevated pulmonary pressures.     Current Meds  Medication Sig   acetaminophen (TYLENOL) 325 MG tablet Take 650 mg by mouth every 6 (six) hours as needed.   aspirin 81 MG tablet Take 81 mg by mouth daily.   carvedilol (COREG) 3.125 MG tablet TAKE 1 TABLET BY MOUTH TWICE A DAY WITH A MEAL. Schedule a visit soon for further refills   citalopram (CELEXA) 10 MG tablet Take 10 mg by mouth daily.   clopidogrel (PLAVIX) 75 MG tablet Take 1 tablet (75 mg total) by mouth daily. KEEP APPT FOR FURTHER REFILLS   desonide (DESOWEN) 0.05 % cream Apply 1 application topically daily. Applies to ears   diclofenac (VOLTAREN) 75 MG EC tablet Take 75 mg by mouth 2 (two) times daily.   furosemide (LASIX) 40 MG tablet Take 1 tablet (40 mg total) by mouth daily. KEEP APPT  FURTHER REFILLS.   levothyroxine (SYNTHROID, LEVOTHROID) 50 MCG tablet Take 50 mcg by mouth at bedtime.    loratadine (CLARITIN) 10 MG tablet Take 10 mg by mouth daily.   losartan (COZAAR) 25 MG tablet Take 1 tablet (25 mg total) by mouth daily.   Multiple Vitamin (MULTIVITAMIN WITH MINERALS) TABS tablet Take 1 tablet by mouth daily.   nitroGLYCERIN (NITROSTAT) 0.4 MG SL tablet Place 1 tablet (0.4 mg total) under the tongue every 5 (five) minutes as needed for chest pain.   omeprazole (PRILOSEC) 20 MG capsule Take by mouth.   Polyethyl Glycol-Propyl Glycol (SYSTANE OP) Place 1 drop into both eyes every 4 (four) hours as needed (dry eyes).   prednisoLONE acetate (PRED FORTE) 1 % ophthalmic suspension Place 1 drop into both eyes daily.   rosuvastatin (CRESTOR) 20 MG tablet Take by mouth.   [DISCONTINUED] losartan (COZAAR) 50 MG tablet Take 50 mg by mouth daily.   [DISCONTINUED] Melatonin 3 MG TABS Take by mouth.   [DISCONTINUED] omeprazole (PRILOSEC) 20 MG capsule TAKE ONE (1) CAPSULE BY MOUTH TWICE DAILY     Allergies:   Codeine   Social History   Tobacco Use   Smoking status: Never Smoker   Smokeless tobacco: Never Used  Substance Use Topics   Alcohol use: Yes    Alcohol/week: 2.0 standard drinks    Types: 1 Standard drinks or equivalent, 1 Shots of liquor per week   Drug use: No     Family Hx: The patient's family history includes AAA (abdominal aortic aneurysm) in her father; Heart attack in her maternal grandfather; Heart disease in her father, mother, and paternal grandmother; Hyperlipidemia in her mother; Hypertension in her mother; Kidney Stones in her father; Stroke in her maternal grandmother.   Prior CV studies:   The following studies were reviewed today:  None  Labs/Other Tests and Data Reviewed:    EKG:  No ECG reviewed.  Recent Labs: PCP June 24, 2018: Potassium 3.5, creatinine 0.8.  Hemoglobin 13.4.  A1c 5.8.  Recent Lipid Panel TC 135,  TG 130, HDL 43, LDL 67.  Wt Readings from Last 3 Encounters:  12/21/18 107 lb 3.2 oz (48.6 kg)  08/19/17 140 lb (63.5 kg)  02/25/17 140 lb 12.8 oz (63.9 kg)     Objective:    Vital Signs:  BP 114/78    Pulse 82    Temp (!) 96.4 F (35.8 C)    Wt 107 lb 3.2 oz (48.6 kg)    BMI 20.94 kg/m   VITAL SIGNS:  reviewed GEN:  NAD.  Well groomed. EYES:  sclerae anicteric, EOMI - Extraocular Movements Intact RESPIRATORY:  normal respiratory effort, symmetric expansion MUSCULOSKELETAL:  no obvious deformities. NEURO:  alert and oriented x 3, no obvious focal deficit ; normal mood & affect.  Hard of hearing.    ASSESSMENT & PLAN:    Problem List Items Addressed This Visit    CAD S/P percutaneous coronary angioplasty - Primary (Chronic)    PCI to the LAD at D1 with moderate downstream disease being treated medically.  This has improved her exertional dyspnea, although she still has chronic exertional dyspnea. Mostly limited by unstable gait and back pain.  Plan: No longer on aspirin.  Is only on Plavix.  He is on stable dose of statin and beta-blocker.  We are reducing losartan dose.  Okay to hold Plavix for procedures.      Relevant Medications   rosuvastatin (CRESTOR) 20 MG tablet   losartan (COZAAR) 25 MG tablet   Cardiomyopathy, ischemic (Chronic)    Mildly reduced EF following PCI of the LAD.  This would probably go along with her improved exertional dyspnea.  The rest of the reduced EF is probably related to LBBB.  She does have some exertional dyspnea, but is really deconditioned and therefore would not adjust to many medications.  She is on a stable dose of Lasix as well as 50 mg losartan and low-dose carvedilol.  Plan: Since she has a dizziness we will reduce her losartan dose to 25 mg daily.       Relevant Medications   rosuvastatin (CRESTOR) 20 MG tablet   losartan (COZAAR) 25 MG tablet   Chronic combined systolic and diastolic CHF, NYHA class 2 (HCC) (Chronic)    Pretty  much stable symptoms.  No real active heart failure.  I am a little worried about her positional dizziness.  We will therefore reduce her losartan to 25 mg daily.  Otherwise continue low-dose carvedilol and standing dose of Lasix.      Relevant Medications   rosuvastatin (CRESTOR) 20 MG tablet   losartan (COZAAR) 25 MG tablet   Dyslipidemia, goal LDL below 70 (Chronic)    Labs well controlled in October on current dose of rosuvastatin.  Continue current dose.  Tolerating well.      Relevant Medications   rosuvastatin (CRESTOR) 20 MG tablet   losartan (COZAAR) 25 MG tablet   Dyspnea (Chronic)      COVID-19 Education: The signs and symptoms of COVID-19 were discussed with the patient and how to seek care for testing (follow up with PCP or arrange E-visit).   The importance of social distancing was discussed today.  Time:   Today, I have spent 20 minutes with the patient with telehealth technology discussing the above problems.     Medication Adjustments/Labs and Tests Ordered: Current medicines are reviewed at length with the patient today.  Concerns regarding medicines are outlined above.  Medication Instructions: Reducing dose of losartan down to 25 mg daily  Tests Ordered: No orders of the defined types were placed in this encounter.   Medication Changes: Meds ordered this encounter  Medications   losartan (COZAAR) 25 MG tablet    Sig: Take 1 tablet (25 mg total) by mouth daily.    Dispense:  90 tablet    Refill:  3  Reduce losartan to 25 mg daily  Disposition:  Follow up in Roughly 6 month(s)    Signed, Glenetta Hew, MD  12/21/2018 5:27 PM    [facility]  [facility]

## 2018-12-21 NOTE — Assessment & Plan Note (Signed)
PCI to the LAD at D1 with moderate downstream disease being treated medically.  This has improved her exertional dyspnea, although she still has chronic exertional dyspnea. Mostly limited by unstable gait and back pain.  Plan: No longer on aspirin.  Is only on Plavix.  He is on stable dose of statin and beta-blocker.  We are reducing losartan dose.  Okay to hold Plavix for procedures.

## 2018-12-21 NOTE — Telephone Encounter (Signed)
Attempted to contact the daughter to advise that the text message would be coming to her before the visit, but she did not answer and no voicemail set up to leave a message.

## 2018-12-21 NOTE — Addendum Note (Signed)
Addended by: Nicki Guadalajara on: 12/21/2018 02:59 PM   Modules accepted: Orders

## 2018-12-21 NOTE — Patient Instructions (Signed)
Medication Instructions:  DECREASE LOSARTAN TO 25 MG ONCE DAILY If you need a refill on your cardiac medications before your next appointment, please call your pharmacy.   Lab work: If you have labs (blood work) drawn today and your tests are completely normal, you will receive your results only by: Marland Kitchen MyChart Message (if you have MyChart) OR . A paper copy in the mail If you have any lab test that is abnormal or we need to change your treatment, we will call you to review the results.  Follow-Up: At [facility], you and your health needs are our priority.  As part of our continuing mission to provide you with exceptional heart care, we have created designated Provider Care Teams.  These Care Teams include your primary Cardiologist (physician) and Advanced Practice Providers (APPs -  Physician Assistants and Nurse Practitioners) who all work together to provide you with the care you need, when you need it. You will need a follow up appointment in 6 months.  Please call our office 2 months in advance to schedule this appointment.  You may see Glenetta Hew MD or one of the following Advanced Practice Providers on your designated Care Team:   Rosaria Ferries, PA-C . Jory Sims, DNP, ANP

## 2018-12-21 NOTE — Assessment & Plan Note (Signed)
Mildly reduced EF following PCI of the LAD.  This would probably go along with her improved exertional dyspnea.  The rest of the reduced EF is probably related to LBBB.  She does have some exertional dyspnea, but is really deconditioned and therefore would not adjust to many medications.  She is on a stable dose of Lasix as well as 50 mg losartan and low-dose carvedilol.  Plan: Since she has a dizziness we will reduce her losartan dose to 25 mg daily.

## 2018-12-21 NOTE — Assessment & Plan Note (Signed)
Pretty much stable symptoms.  No real active heart failure.  I am a little worried about her positional dizziness.  We will therefore reduce her losartan to 25 mg daily.  Otherwise continue low-dose carvedilol and standing dose of Lasix.

## 2018-12-21 NOTE — Telephone Encounter (Signed)
New message  Patient's daughter did not receive a link for the visit today. Please contact the patient.

## 2018-12-21 NOTE — Assessment & Plan Note (Signed)
Labs well controlled in October on current dose of rosuvastatin.  Continue current dose.  Tolerating well.

## 2018-12-21 NOTE — Progress Notes (Signed)
E visit for Allergic Rhinitis We are sorry that you are not feeling well.  Here is how we plan to help!  Based on what you have shared with me it looks like you have Allergic Rhinitis.  Rhinitis is when a reaction occurs that causes nasal congestion, runny nose, sneezing, and itching.  Most types of rhinitis are caused by an inflammation and are associated with symptoms in the eyes ears or throat. There are several types of rhinitis.  The most common are acute rhinitis, which is usually caused by a viral illness, allergic or seasonal rhinitis, and nonallergic or year-round rhinitis.  Nasal allergies occur certain times of the year.  Allergic rhinitis is caused when allergens in the air trigger the release of histamine in the body.  Histamine causes itching, swelling, and fluid to build up in the fragile linings of the nasal passages, sinuses and eyelids.  An itchy nose and clear discharge are common.  I recommend the following over the counter treatments: Any oral allergy medication like Allegra 180 mg one a day, or Zyrtec 10 mg once a day or Loratadine 10 mg one a day.  I also would recommend trial of saline nose rinses This will help clean out any allergens stuck in the hairs of your nose.  Watch this video who to do saline nose rinses.  [email or web address].[email or web address]  Do not use tap water, only boiled water that has been cooled down.  Do it twice a day  for 5-7 days, but avoid bed time.   You may also benefit from eye drops such as: Visine allergy if you need it and the above medication and rinse are not helping.   You may continue the nose spray, but I would recommend that on the side you squirt the spray in your dose, you tilt your head to that side as you are snorting it and repeat the same for the other side, so the spray can reach the eustachian tube which allows fluid to drain( in the  back on your throat), and I believe this is why your ears may feel stuffy, you probably have fluid behind your ear drums.   HOME CARE:   You can use an over-the-counter saline nasal spray as needed  Avoid areas where there is heavy dust, mites, or molds  Stay indoors on windy days during the pollen season  Keep windows closed in home, at least in bedroom; use air conditioner.  Use high-efficiency house air filter  Keep windows closed in car, turn AC on re-circulate  Avoid playing out with dog during pollen season  GET HELP RIGHT AWAY IF:   If your symptoms do not improve within 10 days  You become short of breath  You develop yellow or green discharge from your nose for over 3 days  You have coughing fits  MAKE SURE YOU:   Understand these instructions  Will watch your condition  Will get help right away if you are not doing well or get worse  Thank you for choosing an e-visit. Your e-visit answers were reviewed by a board certified advanced clinical practitioner to complete your personal care plan. Depending upon the condition, your plan could have included both over the counter or prescription medications. Please review your pharmacy choice. Be sure that the pharmacy you have chosen is open so that you can pick up your prescription now.  If there is a problem you may message your provider in [place] to have the prescription routed to another pharmacy.  Your safety is important to [place]. If you have drug allergies check your prescription carefully.  For the next 24 hours, you can use MyChart to ask questions about today's visit, request a non-urgent call back, or ask for a work or school excuse from your e-visit provider. You will get an email in the next two days asking about your experience. I hope that your e-visit has been valuable and will speed your recovery.

## 2018-12-22 DIAGNOSIS — M25511 Pain in right shoulder: Secondary | ICD-10-CM | POA: Diagnosis not present

## 2018-12-22 DIAGNOSIS — R278 Other lack of coordination: Secondary | ICD-10-CM | POA: Diagnosis not present

## 2018-12-22 DIAGNOSIS — N3946 Mixed incontinence: Secondary | ICD-10-CM | POA: Diagnosis not present

## 2018-12-22 DIAGNOSIS — R2681 Unsteadiness on feet: Secondary | ICD-10-CM | POA: Diagnosis not present

## 2018-12-22 DIAGNOSIS — Z9181 History of falling: Secondary | ICD-10-CM | POA: Diagnosis not present

## 2018-12-22 DIAGNOSIS — M6281 Muscle weakness (generalized): Secondary | ICD-10-CM | POA: Diagnosis not present

## 2018-12-22 NOTE — Telephone Encounter (Signed)
Tele-health visit with dr harding complete.

## 2018-12-24 DIAGNOSIS — R278 Other lack of coordination: Secondary | ICD-10-CM | POA: Diagnosis not present

## 2018-12-24 DIAGNOSIS — Z9181 History of falling: Secondary | ICD-10-CM | POA: Diagnosis not present

## 2018-12-24 DIAGNOSIS — M25511 Pain in right shoulder: Secondary | ICD-10-CM | POA: Diagnosis not present

## 2018-12-24 DIAGNOSIS — R2681 Unsteadiness on feet: Secondary | ICD-10-CM | POA: Diagnosis not present

## 2018-12-24 DIAGNOSIS — N3946 Mixed incontinence: Secondary | ICD-10-CM | POA: Diagnosis not present

## 2018-12-24 DIAGNOSIS — M6281 Muscle weakness (generalized): Secondary | ICD-10-CM | POA: Diagnosis not present

## 2018-12-29 DIAGNOSIS — R2681 Unsteadiness on feet: Secondary | ICD-10-CM | POA: Diagnosis not present

## 2018-12-29 DIAGNOSIS — N3946 Mixed incontinence: Secondary | ICD-10-CM | POA: Diagnosis not present

## 2018-12-29 DIAGNOSIS — M25511 Pain in right shoulder: Secondary | ICD-10-CM | POA: Diagnosis not present

## 2018-12-29 DIAGNOSIS — M6281 Muscle weakness (generalized): Secondary | ICD-10-CM | POA: Diagnosis not present

## 2018-12-29 DIAGNOSIS — R278 Other lack of coordination: Secondary | ICD-10-CM | POA: Diagnosis not present

## 2018-12-29 DIAGNOSIS — Z9181 History of falling: Secondary | ICD-10-CM | POA: Diagnosis not present

## 2018-12-30 DIAGNOSIS — I10 Essential (primary) hypertension: Secondary | ICD-10-CM | POA: Diagnosis not present

## 2018-12-30 DIAGNOSIS — E039 Hypothyroidism, unspecified: Secondary | ICD-10-CM | POA: Diagnosis not present

## 2018-12-30 DIAGNOSIS — R413 Other amnesia: Secondary | ICD-10-CM | POA: Diagnosis not present

## 2018-12-30 DIAGNOSIS — S42301D Unspecified fracture of shaft of humerus, right arm, subsequent encounter for fracture with routine healing: Secondary | ICD-10-CM | POA: Diagnosis not present

## 2018-12-30 DIAGNOSIS — G479 Sleep disorder, unspecified: Secondary | ICD-10-CM | POA: Diagnosis not present

## 2018-12-30 DIAGNOSIS — I2510 Atherosclerotic heart disease of native coronary artery without angina pectoris: Secondary | ICD-10-CM | POA: Diagnosis not present

## 2018-12-30 DIAGNOSIS — R7301 Impaired fasting glucose: Secondary | ICD-10-CM | POA: Diagnosis not present

## 2018-12-30 DIAGNOSIS — F39 Unspecified mood [affective] disorder: Secondary | ICD-10-CM | POA: Diagnosis not present

## 2018-12-31 DIAGNOSIS — M25511 Pain in right shoulder: Secondary | ICD-10-CM | POA: Diagnosis not present

## 2018-12-31 DIAGNOSIS — N3946 Mixed incontinence: Secondary | ICD-10-CM | POA: Diagnosis not present

## 2018-12-31 DIAGNOSIS — M6281 Muscle weakness (generalized): Secondary | ICD-10-CM | POA: Diagnosis not present

## 2018-12-31 DIAGNOSIS — R278 Other lack of coordination: Secondary | ICD-10-CM | POA: Diagnosis not present

## 2018-12-31 DIAGNOSIS — R2681 Unsteadiness on feet: Secondary | ICD-10-CM | POA: Diagnosis not present

## 2018-12-31 DIAGNOSIS — Z9181 History of falling: Secondary | ICD-10-CM | POA: Diagnosis not present

## 2019-01-01 DIAGNOSIS — N3946 Mixed incontinence: Secondary | ICD-10-CM | POA: Diagnosis not present

## 2019-01-01 DIAGNOSIS — Z9181 History of falling: Secondary | ICD-10-CM | POA: Diagnosis not present

## 2019-01-01 DIAGNOSIS — M6281 Muscle weakness (generalized): Secondary | ICD-10-CM | POA: Diagnosis not present

## 2019-01-01 DIAGNOSIS — M25511 Pain in right shoulder: Secondary | ICD-10-CM | POA: Diagnosis not present

## 2019-01-04 DIAGNOSIS — Z9181 History of falling: Secondary | ICD-10-CM | POA: Diagnosis not present

## 2019-01-04 DIAGNOSIS — M6281 Muscle weakness (generalized): Secondary | ICD-10-CM | POA: Diagnosis not present

## 2019-01-04 DIAGNOSIS — N3946 Mixed incontinence: Secondary | ICD-10-CM | POA: Diagnosis not present

## 2019-01-04 DIAGNOSIS — M25511 Pain in right shoulder: Secondary | ICD-10-CM | POA: Diagnosis not present

## 2019-01-07 DIAGNOSIS — Z9181 History of falling: Secondary | ICD-10-CM | POA: Diagnosis not present

## 2019-01-07 DIAGNOSIS — M6281 Muscle weakness (generalized): Secondary | ICD-10-CM | POA: Diagnosis not present

## 2019-01-07 DIAGNOSIS — M25511 Pain in right shoulder: Secondary | ICD-10-CM | POA: Diagnosis not present

## 2019-01-07 DIAGNOSIS — N3946 Mixed incontinence: Secondary | ICD-10-CM | POA: Diagnosis not present

## 2019-01-08 DIAGNOSIS — N3946 Mixed incontinence: Secondary | ICD-10-CM | POA: Diagnosis not present

## 2019-01-08 DIAGNOSIS — M6281 Muscle weakness (generalized): Secondary | ICD-10-CM | POA: Diagnosis not present

## 2019-01-08 DIAGNOSIS — Z9181 History of falling: Secondary | ICD-10-CM | POA: Diagnosis not present

## 2019-01-08 DIAGNOSIS — M25511 Pain in right shoulder: Secondary | ICD-10-CM | POA: Diagnosis not present

## 2019-01-11 DIAGNOSIS — N3946 Mixed incontinence: Secondary | ICD-10-CM | POA: Diagnosis not present

## 2019-01-11 DIAGNOSIS — M6281 Muscle weakness (generalized): Secondary | ICD-10-CM | POA: Diagnosis not present

## 2019-01-11 DIAGNOSIS — Z9181 History of falling: Secondary | ICD-10-CM | POA: Diagnosis not present

## 2019-01-11 DIAGNOSIS — M25511 Pain in right shoulder: Secondary | ICD-10-CM | POA: Diagnosis not present

## 2019-01-14 DIAGNOSIS — M6281 Muscle weakness (generalized): Secondary | ICD-10-CM | POA: Diagnosis not present

## 2019-01-14 DIAGNOSIS — M25511 Pain in right shoulder: Secondary | ICD-10-CM | POA: Diagnosis not present

## 2019-01-14 DIAGNOSIS — N3946 Mixed incontinence: Secondary | ICD-10-CM | POA: Diagnosis not present

## 2019-01-14 DIAGNOSIS — Z9181 History of falling: Secondary | ICD-10-CM | POA: Diagnosis not present

## 2019-01-15 DIAGNOSIS — N3946 Mixed incontinence: Secondary | ICD-10-CM | POA: Diagnosis not present

## 2019-01-15 DIAGNOSIS — Z9181 History of falling: Secondary | ICD-10-CM | POA: Diagnosis not present

## 2019-01-15 DIAGNOSIS — M6281 Muscle weakness (generalized): Secondary | ICD-10-CM | POA: Diagnosis not present

## 2019-01-15 DIAGNOSIS — M25511 Pain in right shoulder: Secondary | ICD-10-CM | POA: Diagnosis not present

## 2019-01-18 DIAGNOSIS — Z9181 History of falling: Secondary | ICD-10-CM | POA: Diagnosis not present

## 2019-01-18 DIAGNOSIS — N3946 Mixed incontinence: Secondary | ICD-10-CM | POA: Diagnosis not present

## 2019-01-18 DIAGNOSIS — M25511 Pain in right shoulder: Secondary | ICD-10-CM | POA: Diagnosis not present

## 2019-01-18 DIAGNOSIS — M6281 Muscle weakness (generalized): Secondary | ICD-10-CM | POA: Diagnosis not present

## 2019-01-21 DIAGNOSIS — M25511 Pain in right shoulder: Secondary | ICD-10-CM | POA: Diagnosis not present

## 2019-01-21 DIAGNOSIS — M6281 Muscle weakness (generalized): Secondary | ICD-10-CM | POA: Diagnosis not present

## 2019-01-21 DIAGNOSIS — N3946 Mixed incontinence: Secondary | ICD-10-CM | POA: Diagnosis not present

## 2019-01-21 DIAGNOSIS — Z9181 History of falling: Secondary | ICD-10-CM | POA: Diagnosis not present

## 2019-01-22 DIAGNOSIS — M25511 Pain in right shoulder: Secondary | ICD-10-CM | POA: Diagnosis not present

## 2019-01-22 DIAGNOSIS — Z9181 History of falling: Secondary | ICD-10-CM | POA: Diagnosis not present

## 2019-01-22 DIAGNOSIS — M6281 Muscle weakness (generalized): Secondary | ICD-10-CM | POA: Diagnosis not present

## 2019-01-22 DIAGNOSIS — N3946 Mixed incontinence: Secondary | ICD-10-CM | POA: Diagnosis not present

## 2019-01-26 DIAGNOSIS — Z9181 History of falling: Secondary | ICD-10-CM | POA: Diagnosis not present

## 2019-01-26 DIAGNOSIS — N3946 Mixed incontinence: Secondary | ICD-10-CM | POA: Diagnosis not present

## 2019-01-26 DIAGNOSIS — M6281 Muscle weakness (generalized): Secondary | ICD-10-CM | POA: Diagnosis not present

## 2019-01-26 DIAGNOSIS — M25511 Pain in right shoulder: Secondary | ICD-10-CM | POA: Diagnosis not present

## 2019-01-28 DIAGNOSIS — Z9181 History of falling: Secondary | ICD-10-CM | POA: Diagnosis not present

## 2019-01-28 DIAGNOSIS — M25511 Pain in right shoulder: Secondary | ICD-10-CM | POA: Diagnosis not present

## 2019-01-28 DIAGNOSIS — M6281 Muscle weakness (generalized): Secondary | ICD-10-CM | POA: Diagnosis not present

## 2019-01-28 DIAGNOSIS — N3946 Mixed incontinence: Secondary | ICD-10-CM | POA: Diagnosis not present

## 2019-01-29 DIAGNOSIS — Z9181 History of falling: Secondary | ICD-10-CM | POA: Diagnosis not present

## 2019-01-29 DIAGNOSIS — N3946 Mixed incontinence: Secondary | ICD-10-CM | POA: Diagnosis not present

## 2019-01-29 DIAGNOSIS — M6281 Muscle weakness (generalized): Secondary | ICD-10-CM | POA: Diagnosis not present

## 2019-01-29 DIAGNOSIS — M25511 Pain in right shoulder: Secondary | ICD-10-CM | POA: Diagnosis not present

## 2019-02-01 ENCOUNTER — Other Ambulatory Visit: Payer: Self-pay | Admitting: Cardiology

## 2019-02-02 DIAGNOSIS — M25511 Pain in right shoulder: Secondary | ICD-10-CM | POA: Diagnosis not present

## 2019-02-02 DIAGNOSIS — Z9181 History of falling: Secondary | ICD-10-CM | POA: Diagnosis not present

## 2019-02-02 DIAGNOSIS — N3946 Mixed incontinence: Secondary | ICD-10-CM | POA: Diagnosis not present

## 2019-02-02 DIAGNOSIS — M6281 Muscle weakness (generalized): Secondary | ICD-10-CM | POA: Diagnosis not present

## 2019-02-11 DIAGNOSIS — M6281 Muscle weakness (generalized): Secondary | ICD-10-CM | POA: Diagnosis not present

## 2019-02-11 DIAGNOSIS — Z9181 History of falling: Secondary | ICD-10-CM | POA: Diagnosis not present

## 2019-02-11 DIAGNOSIS — N3946 Mixed incontinence: Secondary | ICD-10-CM | POA: Diagnosis not present

## 2019-02-11 DIAGNOSIS — M25511 Pain in right shoulder: Secondary | ICD-10-CM | POA: Diagnosis not present

## 2019-02-12 DIAGNOSIS — M25511 Pain in right shoulder: Secondary | ICD-10-CM | POA: Diagnosis not present

## 2019-02-12 DIAGNOSIS — M6281 Muscle weakness (generalized): Secondary | ICD-10-CM | POA: Diagnosis not present

## 2019-02-12 DIAGNOSIS — N3946 Mixed incontinence: Secondary | ICD-10-CM | POA: Diagnosis not present

## 2019-02-12 DIAGNOSIS — Z9181 History of falling: Secondary | ICD-10-CM | POA: Diagnosis not present

## 2019-02-15 DIAGNOSIS — Z9181 History of falling: Secondary | ICD-10-CM | POA: Diagnosis not present

## 2019-02-15 DIAGNOSIS — M6281 Muscle weakness (generalized): Secondary | ICD-10-CM | POA: Diagnosis not present

## 2019-02-15 DIAGNOSIS — N3946 Mixed incontinence: Secondary | ICD-10-CM | POA: Diagnosis not present

## 2019-02-15 DIAGNOSIS — M25511 Pain in right shoulder: Secondary | ICD-10-CM | POA: Diagnosis not present

## 2019-02-18 DIAGNOSIS — M25511 Pain in right shoulder: Secondary | ICD-10-CM | POA: Diagnosis not present

## 2019-02-18 DIAGNOSIS — Z9181 History of falling: Secondary | ICD-10-CM | POA: Diagnosis not present

## 2019-02-18 DIAGNOSIS — M6281 Muscle weakness (generalized): Secondary | ICD-10-CM | POA: Diagnosis not present

## 2019-02-18 DIAGNOSIS — N3946 Mixed incontinence: Secondary | ICD-10-CM | POA: Diagnosis not present

## 2019-02-19 DIAGNOSIS — M25511 Pain in right shoulder: Secondary | ICD-10-CM | POA: Diagnosis not present

## 2019-02-19 DIAGNOSIS — N3946 Mixed incontinence: Secondary | ICD-10-CM | POA: Diagnosis not present

## 2019-02-19 DIAGNOSIS — Z9181 History of falling: Secondary | ICD-10-CM | POA: Diagnosis not present

## 2019-02-19 DIAGNOSIS — M6281 Muscle weakness (generalized): Secondary | ICD-10-CM | POA: Diagnosis not present

## 2019-02-22 DIAGNOSIS — M25511 Pain in right shoulder: Secondary | ICD-10-CM | POA: Diagnosis not present

## 2019-02-22 DIAGNOSIS — N3946 Mixed incontinence: Secondary | ICD-10-CM | POA: Diagnosis not present

## 2019-02-22 DIAGNOSIS — M6281 Muscle weakness (generalized): Secondary | ICD-10-CM | POA: Diagnosis not present

## 2019-02-22 DIAGNOSIS — Z9181 History of falling: Secondary | ICD-10-CM | POA: Diagnosis not present

## 2019-02-23 DIAGNOSIS — M6281 Muscle weakness (generalized): Secondary | ICD-10-CM | POA: Diagnosis not present

## 2019-02-23 DIAGNOSIS — N3946 Mixed incontinence: Secondary | ICD-10-CM | POA: Diagnosis not present

## 2019-02-23 DIAGNOSIS — Z9181 History of falling: Secondary | ICD-10-CM | POA: Diagnosis not present

## 2019-02-23 DIAGNOSIS — M25511 Pain in right shoulder: Secondary | ICD-10-CM | POA: Diagnosis not present

## 2019-02-25 DIAGNOSIS — N3946 Mixed incontinence: Secondary | ICD-10-CM | POA: Diagnosis not present

## 2019-02-25 DIAGNOSIS — M25511 Pain in right shoulder: Secondary | ICD-10-CM | POA: Diagnosis not present

## 2019-02-25 DIAGNOSIS — Z9181 History of falling: Secondary | ICD-10-CM | POA: Diagnosis not present

## 2019-02-25 DIAGNOSIS — M6281 Muscle weakness (generalized): Secondary | ICD-10-CM | POA: Diagnosis not present

## 2019-02-26 ENCOUNTER — Other Ambulatory Visit: Payer: Self-pay | Admitting: Cardiology

## 2019-03-01 DIAGNOSIS — Z9181 History of falling: Secondary | ICD-10-CM | POA: Diagnosis not present

## 2019-03-01 DIAGNOSIS — N3946 Mixed incontinence: Secondary | ICD-10-CM | POA: Diagnosis not present

## 2019-03-01 DIAGNOSIS — M25511 Pain in right shoulder: Secondary | ICD-10-CM | POA: Diagnosis not present

## 2019-03-01 DIAGNOSIS — M6281 Muscle weakness (generalized): Secondary | ICD-10-CM | POA: Diagnosis not present

## 2019-03-02 DIAGNOSIS — Z9181 History of falling: Secondary | ICD-10-CM | POA: Diagnosis not present

## 2019-03-02 DIAGNOSIS — M25511 Pain in right shoulder: Secondary | ICD-10-CM | POA: Diagnosis not present

## 2019-03-02 DIAGNOSIS — N3946 Mixed incontinence: Secondary | ICD-10-CM | POA: Diagnosis not present

## 2019-03-02 DIAGNOSIS — M6281 Muscle weakness (generalized): Secondary | ICD-10-CM | POA: Diagnosis not present

## 2019-03-04 DIAGNOSIS — N3946 Mixed incontinence: Secondary | ICD-10-CM | POA: Diagnosis not present

## 2019-03-04 DIAGNOSIS — M6281 Muscle weakness (generalized): Secondary | ICD-10-CM | POA: Diagnosis not present

## 2019-03-04 DIAGNOSIS — M25511 Pain in right shoulder: Secondary | ICD-10-CM | POA: Diagnosis not present

## 2019-03-04 DIAGNOSIS — Z9181 History of falling: Secondary | ICD-10-CM | POA: Diagnosis not present

## 2019-03-08 DIAGNOSIS — Z9181 History of falling: Secondary | ICD-10-CM | POA: Diagnosis not present

## 2019-03-08 DIAGNOSIS — N3946 Mixed incontinence: Secondary | ICD-10-CM | POA: Diagnosis not present

## 2019-03-08 DIAGNOSIS — M6281 Muscle weakness (generalized): Secondary | ICD-10-CM | POA: Diagnosis not present

## 2019-03-08 DIAGNOSIS — M25511 Pain in right shoulder: Secondary | ICD-10-CM | POA: Diagnosis not present

## 2019-03-09 DIAGNOSIS — M25511 Pain in right shoulder: Secondary | ICD-10-CM | POA: Diagnosis not present

## 2019-03-09 DIAGNOSIS — M6281 Muscle weakness (generalized): Secondary | ICD-10-CM | POA: Diagnosis not present

## 2019-03-09 DIAGNOSIS — Z9181 History of falling: Secondary | ICD-10-CM | POA: Diagnosis not present

## 2019-03-09 DIAGNOSIS — N3946 Mixed incontinence: Secondary | ICD-10-CM | POA: Diagnosis not present

## 2019-03-11 DIAGNOSIS — Z9181 History of falling: Secondary | ICD-10-CM | POA: Diagnosis not present

## 2019-03-11 DIAGNOSIS — M6281 Muscle weakness (generalized): Secondary | ICD-10-CM | POA: Diagnosis not present

## 2019-03-11 DIAGNOSIS — N3946 Mixed incontinence: Secondary | ICD-10-CM | POA: Diagnosis not present

## 2019-03-11 DIAGNOSIS — M25511 Pain in right shoulder: Secondary | ICD-10-CM | POA: Diagnosis not present

## 2019-03-15 DIAGNOSIS — M6281 Muscle weakness (generalized): Secondary | ICD-10-CM | POA: Diagnosis not present

## 2019-03-15 DIAGNOSIS — N3946 Mixed incontinence: Secondary | ICD-10-CM | POA: Diagnosis not present

## 2019-03-15 DIAGNOSIS — M25511 Pain in right shoulder: Secondary | ICD-10-CM | POA: Diagnosis not present

## 2019-03-15 DIAGNOSIS — Z9181 History of falling: Secondary | ICD-10-CM | POA: Diagnosis not present

## 2019-03-16 DIAGNOSIS — Z9181 History of falling: Secondary | ICD-10-CM | POA: Diagnosis not present

## 2019-03-16 DIAGNOSIS — M6281 Muscle weakness (generalized): Secondary | ICD-10-CM | POA: Diagnosis not present

## 2019-03-16 DIAGNOSIS — N3946 Mixed incontinence: Secondary | ICD-10-CM | POA: Diagnosis not present

## 2019-03-16 DIAGNOSIS — M25511 Pain in right shoulder: Secondary | ICD-10-CM | POA: Diagnosis not present

## 2019-03-18 DIAGNOSIS — M6281 Muscle weakness (generalized): Secondary | ICD-10-CM | POA: Diagnosis not present

## 2019-03-18 DIAGNOSIS — N3946 Mixed incontinence: Secondary | ICD-10-CM | POA: Diagnosis not present

## 2019-03-18 DIAGNOSIS — Z9181 History of falling: Secondary | ICD-10-CM | POA: Diagnosis not present

## 2019-03-18 DIAGNOSIS — M25511 Pain in right shoulder: Secondary | ICD-10-CM | POA: Diagnosis not present

## 2019-03-22 DIAGNOSIS — M25511 Pain in right shoulder: Secondary | ICD-10-CM | POA: Diagnosis not present

## 2019-03-22 DIAGNOSIS — N3946 Mixed incontinence: Secondary | ICD-10-CM | POA: Diagnosis not present

## 2019-03-22 DIAGNOSIS — Z9181 History of falling: Secondary | ICD-10-CM | POA: Diagnosis not present

## 2019-03-22 DIAGNOSIS — M6281 Muscle weakness (generalized): Secondary | ICD-10-CM | POA: Diagnosis not present

## 2019-03-23 DIAGNOSIS — N3946 Mixed incontinence: Secondary | ICD-10-CM | POA: Diagnosis not present

## 2019-03-23 DIAGNOSIS — M25511 Pain in right shoulder: Secondary | ICD-10-CM | POA: Diagnosis not present

## 2019-03-23 DIAGNOSIS — M6281 Muscle weakness (generalized): Secondary | ICD-10-CM | POA: Diagnosis not present

## 2019-03-23 DIAGNOSIS — Z9181 History of falling: Secondary | ICD-10-CM | POA: Diagnosis not present

## 2019-03-25 DIAGNOSIS — M25511 Pain in right shoulder: Secondary | ICD-10-CM | POA: Diagnosis not present

## 2019-03-25 DIAGNOSIS — Z9181 History of falling: Secondary | ICD-10-CM | POA: Diagnosis not present

## 2019-03-25 DIAGNOSIS — M6281 Muscle weakness (generalized): Secondary | ICD-10-CM | POA: Diagnosis not present

## 2019-03-25 DIAGNOSIS — N3946 Mixed incontinence: Secondary | ICD-10-CM | POA: Diagnosis not present

## 2019-03-29 DIAGNOSIS — M25511 Pain in right shoulder: Secondary | ICD-10-CM | POA: Diagnosis not present

## 2019-03-29 DIAGNOSIS — M6281 Muscle weakness (generalized): Secondary | ICD-10-CM | POA: Diagnosis not present

## 2019-03-29 DIAGNOSIS — Z9181 History of falling: Secondary | ICD-10-CM | POA: Diagnosis not present

## 2019-03-29 DIAGNOSIS — N3946 Mixed incontinence: Secondary | ICD-10-CM | POA: Diagnosis not present

## 2019-03-30 DIAGNOSIS — M25511 Pain in right shoulder: Secondary | ICD-10-CM | POA: Diagnosis not present

## 2019-03-30 DIAGNOSIS — N3946 Mixed incontinence: Secondary | ICD-10-CM | POA: Diagnosis not present

## 2019-03-30 DIAGNOSIS — Z9181 History of falling: Secondary | ICD-10-CM | POA: Diagnosis not present

## 2019-03-30 DIAGNOSIS — M6281 Muscle weakness (generalized): Secondary | ICD-10-CM | POA: Diagnosis not present

## 2019-04-01 ENCOUNTER — Other Ambulatory Visit: Payer: Self-pay | Admitting: Cardiology

## 2019-06-13 ENCOUNTER — Other Ambulatory Visit: Payer: Self-pay | Admitting: Cardiology

## 2019-06-17 ENCOUNTER — Other Ambulatory Visit: Payer: Self-pay | Admitting: Cardiology

## 2019-06-28 DIAGNOSIS — T85398D Other mechanical complication of other ocular prosthetic devices, implants and grafts, subsequent encounter: Secondary | ICD-10-CM | POA: Diagnosis not present

## 2019-06-28 DIAGNOSIS — H52213 Irregular astigmatism, bilateral: Secondary | ICD-10-CM | POA: Diagnosis not present

## 2019-06-28 DIAGNOSIS — H18603 Keratoconus, unspecified, bilateral: Secondary | ICD-10-CM | POA: Diagnosis not present

## 2019-06-28 DIAGNOSIS — H524 Presbyopia: Secondary | ICD-10-CM | POA: Diagnosis not present

## 2019-07-02 DIAGNOSIS — R7301 Impaired fasting glucose: Secondary | ICD-10-CM | POA: Diagnosis not present

## 2019-07-02 DIAGNOSIS — E038 Other specified hypothyroidism: Secondary | ICD-10-CM | POA: Diagnosis not present

## 2019-07-02 DIAGNOSIS — E7849 Other hyperlipidemia: Secondary | ICD-10-CM | POA: Diagnosis not present

## 2019-07-15 DIAGNOSIS — R82998 Other abnormal findings in urine: Secondary | ICD-10-CM | POA: Diagnosis not present

## 2019-07-21 ENCOUNTER — Ambulatory Visit: Payer: Medicare Other | Admitting: Cardiology

## 2019-08-09 DIAGNOSIS — Z20828 Contact with and (suspected) exposure to other viral communicable diseases: Secondary | ICD-10-CM | POA: Diagnosis not present

## 2019-08-09 DIAGNOSIS — R05 Cough: Secondary | ICD-10-CM | POA: Diagnosis not present

## 2019-08-09 DIAGNOSIS — J159 Unspecified bacterial pneumonia: Secondary | ICD-10-CM | POA: Diagnosis not present

## 2019-08-09 DIAGNOSIS — I5041 Acute combined systolic (congestive) and diastolic (congestive) heart failure: Secondary | ICD-10-CM | POA: Diagnosis not present

## 2019-08-09 DIAGNOSIS — J069 Acute upper respiratory infection, unspecified: Secondary | ICD-10-CM | POA: Diagnosis not present

## 2019-08-10 ENCOUNTER — Telehealth: Payer: Medicare Other | Admitting: Cardiology

## 2019-09-09 DIAGNOSIS — M6281 Muscle weakness (generalized): Secondary | ICD-10-CM | POA: Diagnosis not present

## 2019-09-09 DIAGNOSIS — Z9181 History of falling: Secondary | ICD-10-CM | POA: Diagnosis not present

## 2019-09-09 DIAGNOSIS — R262 Difficulty in walking, not elsewhere classified: Secondary | ICD-10-CM | POA: Diagnosis not present

## 2019-09-10 DIAGNOSIS — M6281 Muscle weakness (generalized): Secondary | ICD-10-CM | POA: Diagnosis not present

## 2019-09-10 DIAGNOSIS — Z9181 History of falling: Secondary | ICD-10-CM | POA: Diagnosis not present

## 2019-09-10 DIAGNOSIS — R262 Difficulty in walking, not elsewhere classified: Secondary | ICD-10-CM | POA: Diagnosis not present

## 2019-09-13 DIAGNOSIS — M6281 Muscle weakness (generalized): Secondary | ICD-10-CM | POA: Diagnosis not present

## 2019-09-13 DIAGNOSIS — Z9181 History of falling: Secondary | ICD-10-CM | POA: Diagnosis not present

## 2019-09-13 DIAGNOSIS — R262 Difficulty in walking, not elsewhere classified: Secondary | ICD-10-CM | POA: Diagnosis not present

## 2019-09-14 DIAGNOSIS — M6281 Muscle weakness (generalized): Secondary | ICD-10-CM | POA: Diagnosis not present

## 2019-09-14 DIAGNOSIS — R262 Difficulty in walking, not elsewhere classified: Secondary | ICD-10-CM | POA: Diagnosis not present

## 2019-09-14 DIAGNOSIS — Z9181 History of falling: Secondary | ICD-10-CM | POA: Diagnosis not present

## 2019-09-16 DIAGNOSIS — M6281 Muscle weakness (generalized): Secondary | ICD-10-CM | POA: Diagnosis not present

## 2019-09-16 DIAGNOSIS — R262 Difficulty in walking, not elsewhere classified: Secondary | ICD-10-CM | POA: Diagnosis not present

## 2019-09-16 DIAGNOSIS — Z9181 History of falling: Secondary | ICD-10-CM | POA: Diagnosis not present

## 2019-09-17 ENCOUNTER — Telehealth: Payer: Self-pay | Admitting: *Deleted

## 2019-09-17 ENCOUNTER — Encounter: Payer: Self-pay | Admitting: Cardiology

## 2019-09-17 ENCOUNTER — Telehealth (INDEPENDENT_AMBULATORY_CARE_PROVIDER_SITE_OTHER): Payer: Medicare Other | Admitting: Cardiology

## 2019-09-17 VITALS — BP 129/78 | HR 79 | Ht 59.0 in | Wt 130.0 lb

## 2019-09-17 DIAGNOSIS — I255 Ischemic cardiomyopathy: Secondary | ICD-10-CM | POA: Diagnosis not present

## 2019-09-17 DIAGNOSIS — I5042 Chronic combined systolic (congestive) and diastolic (congestive) heart failure: Secondary | ICD-10-CM | POA: Diagnosis not present

## 2019-09-17 DIAGNOSIS — E785 Hyperlipidemia, unspecified: Secondary | ICD-10-CM

## 2019-09-17 DIAGNOSIS — Z9861 Coronary angioplasty status: Secondary | ICD-10-CM | POA: Diagnosis not present

## 2019-09-17 DIAGNOSIS — I2510 Atherosclerotic heart disease of native coronary artery without angina pectoris: Secondary | ICD-10-CM | POA: Diagnosis not present

## 2019-09-17 NOTE — Assessment & Plan Note (Signed)
For having reduced EF she really is not noticing any significant heart failure symptoms.  On standing dose of Lasix with carvedilol now having had losartan stopped because of dizziness.  Doing well.  As long as she is feeling fine would not plan to reevaluate with additional studies unless symptoms worsen.

## 2019-09-17 NOTE — Progress Notes (Signed)
Virtual Visit via Video Note   This visit type was conducted due to national recommendations for restrictions regarding the COVID-19 Pandemic (e.g. social distancing) in an effort to limit this patient's exposure and mitigate transmission in our community.  Due to her co-morbid illnesses, this patient is at least at moderate risk for complications without adequate follow up.  This format is felt to be most appropriate for this patient at this time.  All issues noted in this document were discussed and addressed.  A limited physical exam was performed with this format.  Please refer to the patient's chart for her consent to telehealth for [facility].   Patient has given verbal permission to conduct this visit via virtual appointment and to bill insurance 09/17/2019 11:55 AM     Evaluation Performed:  Follow-up visit  Date:  09/17/2019   ID:  Leah Trujillo, DOB 1930-10-06, MRN [id number]  Patient Location: Home; with her daughter Provider Location: Office  PCP:  Prince Solian, MD  Cardiologist:  Glenetta Hew, MD Electrophysiologist:  None   Chief Complaint:   Chief Complaint  Patient presents with  . 6 month visit    no complaints voiced.  pat had pnuemonia before Christmas - it has resolved. not taking Losaratn - stopped by primary . not taking Pravastatin    History of Present Illness:    Leah Trujillo is a 86 y.o. female with PMH notable for CAD-LAD PCI (October 2017) who presents via [employer] (consulting) for a telehealth visit today.  CAD History:  Initial consult 06/24/2016-worsening exertional dyspnea-simply walking across the street.  (LBBB noted on EKG with frequent PVCs)  Echo: EF 35 to 40% diffuse HK.  Moderate pulmonary pretension. ->  Referred for cath  Cath: Proximal LAD FFR positive lesion-DES PCI with severely elevated LVEDP.  April 2019 she suffered a fall with right pelvic and humerus fracture.  Leah Trujillo was last seen via  [employer] on December 21, 2018 (at the beginning of the COVID-19 lockdown).  This was facilitated by her daughter who brought her home from assisted living facility.  Starting to show signs of depressions from social distancing.  Low appetite, not eating as much.  Noted some exertional dyspnea but no chest pain.  Had been doing PT.  Also noted morning dizziness.  She transferred from the assisted living facility of her retirement community to the independent living.  Her daughter now sets up her medications 1 week at a time for her.  Hospitalizations:  . None   Recent - Interim CV studies:    The following studies were reviewed today: . None:  Inerval History   Leah Trujillo is again being evaluated by [employer] Doing fairly well today.  Her daughter is actually at her apartment to assist with video conferencing.  She just completed a course of antibiotics and is on steroids for a bout of pneumonia over the last few weeks.  She had really bad coughing and congestion that is starting to clear up now.  She was coughing up a lot of phlegm but that has calmed down now and the cough is from is gone.  Along with that her shortness of breath is gone as well.  When she first tried getting sick she felt low-grade fever and was dizzy.  She ruled out for COVID-19.  She recovered relatively well.  Otherwise from a cardiac standpoint, she tells me she is always somewhat short of breath with exertion,  but did notice a significant provement since her PCI.  She never actually had had any chest pain or pressure nor does she now.  She has some mild end of day edema but no significant PND orthopnea.  She recently started back doing physical therapy for balance issues and weakness.  She actually enjoys doing this type of controlled exercise.  No shortness of breath or chest pain.  She was noticing some dizziness and and and unsteadiness in her PCP Herbst has stopped  losartan for allowing mild increase in blood pressure.  Cardiovascular ROS: positive for - dyspnea on exertion and This is pretty much at her baseline. negative for - edema, irregular heartbeat, orthopnea, palpitations, paroxysmal nocturnal dyspnea, rapid heart rate, shortness of breath or Syncope/near syncope, TIA/amaurosis fugax, claudication.   ROS:  Please see the history of present illness.    The patient does not have symptoms concerning for COVID-19 infection (fever, chills, cough, or new shortness of breath).  In fact she just ruled out for Covid. Review of Systems  Constitutional: Positive for fever (No further fever since being treated for pneumonia) and malaise/fatigue (Actually her energy level had picked up until her recent bout of pneumonia.  Currently now she is more tired.).  HENT: Positive for congestion (Starting to clear up as the her restaurant tract infection is clearing).   Respiratory: Positive for shortness of breath (Baseline, with exertion). Negative for hemoptysis and wheezing.        Recent bout of pneumonia with significant cough and wheezing along with nasal and chest congestion  Cardiovascular: Negative for leg swelling (Trivial).  Gastrointestinal: Positive for constipation (With intermittent loose stool). Negative for blood in stool and melena.  Genitourinary: Negative for hematuria.  Musculoskeletal: Positive for joint pain (Standard arthritis pain). Negative for falls.  Neurological: Positive for dizziness (More related to poor balance.) and weakness (Legs). Negative for focal weakness.  Psychiatric/Behavioral: Positive for memory loss. Negative for depression. The patient is not nervous/anxious and does not have insomnia.    The patient is practicing social distancing and masking.  Past Medical History:  Diagnosis Date  . CAD S/P percutaneous coronary angioplasty 07/09/2016   a. Cath: LM nl, pLAD 75% (FFR 0.79 -> 2.25 x 12 Synergy DES), mLAD 55%, ost D1  50%, LCX nl, OM1nl, RCA large, min irregs, EF 35%, EDP 25-74mmHg.  Marland Kitchen Chronic radicular pain of lower back   . Chronic systolic CHF (congestive heart failure) ([place])    a. 05/2016 Echo: EF 35-40%, diff HK, Gr1 DD.  Marland Kitchen Complication of anesthesia    "takes alot to put me down" (07/09/2016)  . DJD (degenerative joint disease), thoracolumbar    Chronic back pain but also neck and knees  . Dysthymia    Well-controlled on Celexa  . Family history of adverse reaction to anesthesia    "takes alot to put my family down" (07/09/2016)  . GERD (gastroesophageal reflux disease)   . Headache    "pretty often" (07/09/2016)  . High cholesterol   . History of kidney stones   . Hypertension   . Hypothyroidism (acquired)    On thyroid replacement  . Impaired fasting glucose    He will A1c 5.7 from August 2017  . Ischemic cardiomyopathy; With some non-ischemic component    a. 05/2016 Echo: EF 35-40%.; PCI of pLAD with DES.  . LBBB (left bundle branch block)   . OAB (overactive bladder)    Past Surgical History:  Procedure Laterality Date  . CARDIAC CATHETERIZATION  N/A 07/09/2016   Procedure: Left Heart Cath and Coronary Angiography;  Surgeon: Leonie Man, MD;  Location: [place] [facility];  Service: Cardiovascular: LM nl, pLAD 75% (FFR 0.79 -> 2.25 x 12 Synergy DES), mLAD 55%, ost D1 50%, LCX nl, OM1nl, RCA large, min irregs, EF 35%, EDP 25-29mmHg.  Marland Kitchen CARDIAC CATHETERIZATION N/A 07/09/2016   Procedure: Intravascular Pressure Wire/FFR Study;  Surgeon: Leonie Man, MD;  Location: [place] [facility];  Service: Cardiovascular: pLAD R: 0.79  . CARDIAC CATHETERIZATION N/A 07/09/2016   Procedure: Coronary Stent Intervention;  Surgeon: Leonie Man, MD;  Location: [place] [facility];  Service: Cardiovascular: pLAD (@D1 ) PCI with SYNERGY DES 2.25 X 12 (post-dialted to 2.6 mm)  . CATARACT EXTRACTION W/ INTRAOCULAR LENS  IMPLANT, BILATERAL Bilateral   . CORNEAL TRANSPLANT  1970-2013   "3 in right eye; 1 in  left eye"  . CORONARY ANGIOPLASTY WITH STENT PLACEMENT  07/09/2016  . EYE SURGERY    . [place]   "cut me 1/2 way open"  . TONSILLECTOMY    . TRANSTHORACIC ECHOCARDIOGRAM  05/17/2016   Normal LV size. Moderate-severely reduced EF of 35-40% with diffuse HK. Only GR 1 DD noted.  Abdomen normal, dyssynchronous septal motion secondary to LBBB. Moderate TR with PA pressures estimated at 44 mmHg. (Moderate pulmonary hypertension.  . TRANSTHORACIC ECHOCARDIOGRAM  10/15/2016   Post PCI: EF 45-50%. Still has some mild mid to apical anteroseptal hypokinesis. GR 1 DD. Mild septal dyssynergy related to the. Mild MR, mild TR. Mild LAE and mildly elevated pulmonary pressures.     Current Meds  Medication Sig  . acetaminophen (TYLENOL) 325 MG tablet Take 650 mg by mouth every 6 (six) hours as needed.  . carvedilol (COREG) 3.125 MG tablet TAKE 1 TABLET BY MOUTH TWICE A DAY WITH MEALS  . citalopram (CELEXA) 10 MG tablet Take 10 mg by mouth daily.  . clopidogrel (PLAVIX) 75 MG tablet TAKE 1 TABLET BY MOUTH EVERY DAY  . desonide (DESOWEN) 0.05 % cream Apply 1 application topically daily. Applies to ears  . diclofenac Sodium (VOLTAREN) 1 % GEL Apply 2 g topically as needed.  . furosemide (LASIX) 40 MG tablet Take 1 tablet (40 mg total) by mouth daily.  Marland Kitchen levothyroxine (SYNTHROID) 75 MCG tablet Take 75 mcg by mouth daily before breakfast.  . loratadine (CLARITIN) 10 MG tablet Take 10 mg by mouth daily as needed.   Marland Kitchen LORazepam (ATIVAN) 0.5 MG tablet Take 0.5 mg by mouth at bedtime.  . Melatonin 10 MG TABS One qhs  . naproxen sodium (ANAPROX) 220 MG tablet Take 220 mg by mouth 2 (two) times daily as needed.   . nitroGLYCERIN (NITROSTAT) 0.4 MG SL tablet Place 1 tablet (0.4 mg total) under the tongue every 5 (five) minutes as needed for chest pain.  Marland Kitchen omeprazole (PRILOSEC) 20 MG capsule Take by mouth 2 (two) times daily before a meal.   . Polyethyl Glycol-Propyl Glycol (SYSTANE OP) Place 1 drop  into both eyes every 4 (four) hours as needed (dry eyes).  . prednisoLONE acetate (PRED FORTE) 1 % ophthalmic suspension Place 1 drop into both eyes daily.  . rosuvastatin (CRESTOR) 20 MG tablet Take by mouth.     Allergies:   Codeine   Social History   Tobacco Use  . Smoking status: Never Smoker  . Smokeless tobacco: Never Used  Substance Use Topics  . Alcohol use: Yes    Alcohol/week: 2.0 standard drinks  Types: 1 Standard drinks or equivalent, 1 Shots of liquor per week  . Drug use: No     Family Hx: The patient's family history includes AAA (abdominal aortic aneurysm) in her father; Heart attack in her maternal grandfather; Heart disease in her father, mother, and paternal grandmother; Hyperlipidemia in her mother; Hypertension in her mother; Kidney Stones in her father; Stroke in her maternal grandmother.   Labs/Other Tests and Data Reviewed:    EKG:  No ECG reviewed.  Recent Labs: No results found for requested labs within last 8760 hours.   Recent Lipid Panel 07/02/2019: TC 139, TG 106, HDL 41, LDL 65.  A1c 5.9.  Hgb 14.2.  CR 0.8.  Wt Readings from Last 3 Encounters:  09/17/19 130 lb (59 kg)  12/21/18 107 lb 3.2 oz (48.6 kg)  08/19/17 140 lb (63.5 kg)  Record from April 2020 is inaccurate  Objective:    Vital Signs:  BP 129/78   Pulse 79   Ht 4\' 11"  (1.499 m)   Wt 130 lb (59 kg)   BMI 26.26 kg/m   VITAL SIGNS:  reviewed GEN:  NAD.  Well-groomed. RESPIRATORY:  normal respiratory effort, symmetric expansion MUSCULOSKELETAL:  no obvious deformities. NEURO:  Normal mood and affect.  Very hard of hearing. PSYCH:  Normal mood and affect, but very poor historian   ASSESSMENT & PLAN:    Problem List Items Addressed This Visit    Cardiomyopathy, ischemic (Chronic)    She did have an appreciable increase in EF after PCI to the LAD.  I suspect some is related to her bundle branch block.  She does not have active heart failure symptoms besides exertional  dyspnea which is multifactorial.  I had recently reduced her losartan because of dizziness, and her PCP is fully stopped that.  She is feeling better now with more energy.  For now simply continue low-dose beta-blocker.  She is on standing dose of Lasix with well-controlled edema.  No PND or orthopnea.      Chronic combined systolic and diastolic CHF, NYHA class 2 (HCC) (Chronic)    For having reduced EF she really is not noticing any significant heart failure symptoms.  On standing dose of Lasix with carvedilol now having had losartan stopped because of dizziness.  Doing well.  As long as she is feeling fine would not plan to reevaluate with additional studies unless symptoms worsen.      Dyslipidemia, goal LDL below 70 (Chronic)    Labs look well-controlled.  Monitored by PCP.  On current dose of rosuvastatin.  No change.      CAD S/P percutaneous coronary angioplasty - Primary (Chronic)    She does feel better after PCI to the LAD.  This did improve her dyspnea.  She still has chronic dyspnea, but is feeling better.  We stopped aspirin.  Continue statin and beta-blocker.  Okay to hold Plavix for procedures-5-7 days.         COVID-19 Education: The signs and symptoms of COVID-19 were discussed with the patient and how to seek care for testing (follow up with PCP or arrange E-visit).   The importance of social distancing was discussed today.  Time:   Today, I have spent 22 minutes with the patient with telehealth technology discussing the above problems.  Additional 5 to 6 minutes in chart review and charting   Medication Adjustments/Labs and Tests Ordered: Current medicines are reviewed at length with the patient today.  Concerns regarding medicines are outlined  above.   Patient Instructions  Medication Instructions:  None -- > Agree with stopping Losartan  *If you need a refill on your cardiac medications before your next appointment, please call your pharmacy*  Lab  Work: none  Testing/Procedures: none  Follow-Up: At [facility], you and your health needs are our priority.  As part of our continuing mission to provide you with exceptional heart care, we have created designated Provider Care Teams.  These Care Teams include your primary Cardiologist (physician) and Advanced Practice Providers (APPs -  Physician Assistants and Nurse Practitioners) who all work together to provide you with the care you need, when you need it.  Your next appointment:   6 month(s)  The format for your next appointment:   Either In Person or Virtual  Provider:   Glenetta Hew, MD  Other Instructions Keep staying active     Signed, Glenetta Hew, MD  09/17/2019 11:55 AM    [place]

## 2019-09-17 NOTE — Patient Instructions (Addendum)
Medication Instructions:  None -- > Agree with stopping Losartan  *If you need a refill on your cardiac medications before your next appointment, please call your pharmacy*  Lab Work: none  Testing/Procedures: none  Follow-Up: At [facility], you and your health needs are our priority.  As part of our continuing mission to provide you with exceptional heart care, we have created designated Provider Care Teams.  These Care Teams include your primary Cardiologist (physician) and Advanced Practice Providers (APPs -  Physician Assistants and Nurse Practitioners) who all work together to provide you with the care you need, when you need it.  Your next appointment:   6 month(s)  The format for your next appointment:   Either In Person or Virtual  Provider:   Glenetta Hew, MD  Other Instructions Keep staying active

## 2019-09-17 NOTE — Assessment & Plan Note (Signed)
She did have an appreciable increase in EF after PCI to the LAD.  I suspect some is related to her bundle branch block.  She does not have active heart failure symptoms besides exertional dyspnea which is multifactorial.  I had recently reduced her losartan because of dizziness, and her PCP is fully stopped that.  She is feeling better now with more energy.  For now simply continue low-dose beta-blocker.  She is on standing dose of Lasix with well-controlled edema.  No PND or orthopnea.

## 2019-09-17 NOTE — Assessment & Plan Note (Signed)
She does feel better after PCI to the LAD.  This did improve her dyspnea.  She still has chronic dyspnea, but is feeling better.  We stopped aspirin.  Continue statin and beta-blocker.  Okay to hold Plavix for procedures-5-7 days.

## 2019-09-17 NOTE — Assessment & Plan Note (Addendum)
Labs look well-controlled.  Monitored by PCP.  On current dose of rosuvastatin.  No change.

## 2019-09-17 NOTE — Telephone Encounter (Signed)
RN left message for  Patient and daughter Mickel Baas . Instruction were given  from today's virtual visit 09/17/19 .  AVS SUMMARY has been sent by mychart .   any question may call back .

## 2019-09-18 ENCOUNTER — Other Ambulatory Visit: Payer: Self-pay | Admitting: Cardiology

## 2019-09-20 DIAGNOSIS — R262 Difficulty in walking, not elsewhere classified: Secondary | ICD-10-CM | POA: Diagnosis not present

## 2019-09-20 DIAGNOSIS — Z9181 History of falling: Secondary | ICD-10-CM | POA: Diagnosis not present

## 2019-09-20 DIAGNOSIS — M6281 Muscle weakness (generalized): Secondary | ICD-10-CM | POA: Diagnosis not present

## 2019-09-21 DIAGNOSIS — M6281 Muscle weakness (generalized): Secondary | ICD-10-CM | POA: Diagnosis not present

## 2019-09-21 DIAGNOSIS — Z9181 History of falling: Secondary | ICD-10-CM | POA: Diagnosis not present

## 2019-09-21 DIAGNOSIS — R262 Difficulty in walking, not elsewhere classified: Secondary | ICD-10-CM | POA: Diagnosis not present

## 2019-09-23 DIAGNOSIS — Z9181 History of falling: Secondary | ICD-10-CM | POA: Diagnosis not present

## 2019-09-23 DIAGNOSIS — M6281 Muscle weakness (generalized): Secondary | ICD-10-CM | POA: Diagnosis not present

## 2019-09-23 DIAGNOSIS — R262 Difficulty in walking, not elsewhere classified: Secondary | ICD-10-CM | POA: Diagnosis not present

## 2019-09-26 DIAGNOSIS — R262 Difficulty in walking, not elsewhere classified: Secondary | ICD-10-CM | POA: Diagnosis not present

## 2019-09-26 DIAGNOSIS — M6281 Muscle weakness (generalized): Secondary | ICD-10-CM | POA: Diagnosis not present

## 2019-09-26 DIAGNOSIS — Z9181 History of falling: Secondary | ICD-10-CM | POA: Diagnosis not present

## 2019-09-28 DIAGNOSIS — Z9181 History of falling: Secondary | ICD-10-CM | POA: Diagnosis not present

## 2019-09-28 DIAGNOSIS — M6281 Muscle weakness (generalized): Secondary | ICD-10-CM | POA: Diagnosis not present

## 2019-09-28 DIAGNOSIS — R262 Difficulty in walking, not elsewhere classified: Secondary | ICD-10-CM | POA: Diagnosis not present

## 2019-09-30 DIAGNOSIS — M6281 Muscle weakness (generalized): Secondary | ICD-10-CM | POA: Diagnosis not present

## 2019-09-30 DIAGNOSIS — Z9181 History of falling: Secondary | ICD-10-CM | POA: Diagnosis not present

## 2019-09-30 DIAGNOSIS — R262 Difficulty in walking, not elsewhere classified: Secondary | ICD-10-CM | POA: Diagnosis not present

## 2019-10-05 DIAGNOSIS — R262 Difficulty in walking, not elsewhere classified: Secondary | ICD-10-CM | POA: Diagnosis not present

## 2019-10-05 DIAGNOSIS — Z9181 History of falling: Secondary | ICD-10-CM | POA: Diagnosis not present

## 2019-10-05 DIAGNOSIS — M6281 Muscle weakness (generalized): Secondary | ICD-10-CM | POA: Diagnosis not present

## 2019-10-06 DIAGNOSIS — R262 Difficulty in walking, not elsewhere classified: Secondary | ICD-10-CM | POA: Diagnosis not present

## 2019-10-06 DIAGNOSIS — Z9181 History of falling: Secondary | ICD-10-CM | POA: Diagnosis not present

## 2019-10-06 DIAGNOSIS — M6281 Muscle weakness (generalized): Secondary | ICD-10-CM | POA: Diagnosis not present

## 2019-10-08 DIAGNOSIS — R262 Difficulty in walking, not elsewhere classified: Secondary | ICD-10-CM | POA: Diagnosis not present

## 2019-10-08 DIAGNOSIS — M6281 Muscle weakness (generalized): Secondary | ICD-10-CM | POA: Diagnosis not present

## 2019-10-08 DIAGNOSIS — Z9181 History of falling: Secondary | ICD-10-CM | POA: Diagnosis not present

## 2019-10-11 DIAGNOSIS — R262 Difficulty in walking, not elsewhere classified: Secondary | ICD-10-CM | POA: Diagnosis not present

## 2019-10-11 DIAGNOSIS — Z9181 History of falling: Secondary | ICD-10-CM | POA: Diagnosis not present

## 2019-10-11 DIAGNOSIS — M6281 Muscle weakness (generalized): Secondary | ICD-10-CM | POA: Diagnosis not present

## 2019-10-12 DIAGNOSIS — Z9181 History of falling: Secondary | ICD-10-CM | POA: Diagnosis not present

## 2019-10-12 DIAGNOSIS — M6281 Muscle weakness (generalized): Secondary | ICD-10-CM | POA: Diagnosis not present

## 2019-10-12 DIAGNOSIS — R262 Difficulty in walking, not elsewhere classified: Secondary | ICD-10-CM | POA: Diagnosis not present

## 2019-10-13 DIAGNOSIS — M6281 Muscle weakness (generalized): Secondary | ICD-10-CM | POA: Diagnosis not present

## 2019-10-13 DIAGNOSIS — Z9181 History of falling: Secondary | ICD-10-CM | POA: Diagnosis not present

## 2019-10-13 DIAGNOSIS — R262 Difficulty in walking, not elsewhere classified: Secondary | ICD-10-CM | POA: Diagnosis not present

## 2019-10-20 DIAGNOSIS — R262 Difficulty in walking, not elsewhere classified: Secondary | ICD-10-CM | POA: Diagnosis not present

## 2019-10-20 DIAGNOSIS — M6281 Muscle weakness (generalized): Secondary | ICD-10-CM | POA: Diagnosis not present

## 2019-10-20 DIAGNOSIS — Z9181 History of falling: Secondary | ICD-10-CM | POA: Diagnosis not present

## 2019-10-22 DIAGNOSIS — R262 Difficulty in walking, not elsewhere classified: Secondary | ICD-10-CM | POA: Diagnosis not present

## 2019-10-22 DIAGNOSIS — M6281 Muscle weakness (generalized): Secondary | ICD-10-CM | POA: Diagnosis not present

## 2019-10-22 DIAGNOSIS — Z9181 History of falling: Secondary | ICD-10-CM | POA: Diagnosis not present

## 2019-10-25 DIAGNOSIS — M6281 Muscle weakness (generalized): Secondary | ICD-10-CM | POA: Diagnosis not present

## 2019-10-25 DIAGNOSIS — Z9181 History of falling: Secondary | ICD-10-CM | POA: Diagnosis not present

## 2019-10-25 DIAGNOSIS — R262 Difficulty in walking, not elsewhere classified: Secondary | ICD-10-CM | POA: Diagnosis not present

## 2019-10-27 DIAGNOSIS — R262 Difficulty in walking, not elsewhere classified: Secondary | ICD-10-CM | POA: Diagnosis not present

## 2019-10-27 DIAGNOSIS — M6281 Muscle weakness (generalized): Secondary | ICD-10-CM | POA: Diagnosis not present

## 2019-10-27 DIAGNOSIS — Z9181 History of falling: Secondary | ICD-10-CM | POA: Diagnosis not present

## 2019-10-29 DIAGNOSIS — M6281 Muscle weakness (generalized): Secondary | ICD-10-CM | POA: Diagnosis not present

## 2019-10-29 DIAGNOSIS — R262 Difficulty in walking, not elsewhere classified: Secondary | ICD-10-CM | POA: Diagnosis not present

## 2019-10-29 DIAGNOSIS — Z9181 History of falling: Secondary | ICD-10-CM | POA: Diagnosis not present

## 2019-11-01 DIAGNOSIS — Z9181 History of falling: Secondary | ICD-10-CM | POA: Diagnosis not present

## 2019-11-01 DIAGNOSIS — R262 Difficulty in walking, not elsewhere classified: Secondary | ICD-10-CM | POA: Diagnosis not present

## 2019-11-01 DIAGNOSIS — M6281 Muscle weakness (generalized): Secondary | ICD-10-CM | POA: Diagnosis not present

## 2019-11-03 DIAGNOSIS — Z9181 History of falling: Secondary | ICD-10-CM | POA: Diagnosis not present

## 2019-11-03 DIAGNOSIS — M6281 Muscle weakness (generalized): Secondary | ICD-10-CM | POA: Diagnosis not present

## 2019-11-03 DIAGNOSIS — R262 Difficulty in walking, not elsewhere classified: Secondary | ICD-10-CM | POA: Diagnosis not present

## 2019-11-08 DIAGNOSIS — Z9181 History of falling: Secondary | ICD-10-CM | POA: Diagnosis not present

## 2019-11-08 DIAGNOSIS — M6281 Muscle weakness (generalized): Secondary | ICD-10-CM | POA: Diagnosis not present

## 2019-11-08 DIAGNOSIS — R262 Difficulty in walking, not elsewhere classified: Secondary | ICD-10-CM | POA: Diagnosis not present

## 2019-11-10 DIAGNOSIS — R262 Difficulty in walking, not elsewhere classified: Secondary | ICD-10-CM | POA: Diagnosis not present

## 2019-11-10 DIAGNOSIS — M6281 Muscle weakness (generalized): Secondary | ICD-10-CM | POA: Diagnosis not present

## 2019-11-10 DIAGNOSIS — Z9181 History of falling: Secondary | ICD-10-CM | POA: Diagnosis not present

## 2019-11-12 DIAGNOSIS — R262 Difficulty in walking, not elsewhere classified: Secondary | ICD-10-CM | POA: Diagnosis not present

## 2019-11-12 DIAGNOSIS — Z9181 History of falling: Secondary | ICD-10-CM | POA: Diagnosis not present

## 2019-11-12 DIAGNOSIS — M6281 Muscle weakness (generalized): Secondary | ICD-10-CM | POA: Diagnosis not present

## 2019-11-15 DIAGNOSIS — Z9181 History of falling: Secondary | ICD-10-CM | POA: Diagnosis not present

## 2019-11-15 DIAGNOSIS — R262 Difficulty in walking, not elsewhere classified: Secondary | ICD-10-CM | POA: Diagnosis not present

## 2019-11-15 DIAGNOSIS — M6281 Muscle weakness (generalized): Secondary | ICD-10-CM | POA: Diagnosis not present

## 2019-11-17 DIAGNOSIS — R262 Difficulty in walking, not elsewhere classified: Secondary | ICD-10-CM | POA: Diagnosis not present

## 2019-11-17 DIAGNOSIS — Z9181 History of falling: Secondary | ICD-10-CM | POA: Diagnosis not present

## 2019-11-17 DIAGNOSIS — M6281 Muscle weakness (generalized): Secondary | ICD-10-CM | POA: Diagnosis not present

## 2019-11-19 DIAGNOSIS — R262 Difficulty in walking, not elsewhere classified: Secondary | ICD-10-CM | POA: Diagnosis not present

## 2019-11-19 DIAGNOSIS — Z9181 History of falling: Secondary | ICD-10-CM | POA: Diagnosis not present

## 2019-11-19 DIAGNOSIS — M6281 Muscle weakness (generalized): Secondary | ICD-10-CM | POA: Diagnosis not present

## 2019-11-22 DIAGNOSIS — Z9181 History of falling: Secondary | ICD-10-CM | POA: Diagnosis not present

## 2019-11-22 DIAGNOSIS — M6281 Muscle weakness (generalized): Secondary | ICD-10-CM | POA: Diagnosis not present

## 2019-11-22 DIAGNOSIS — R262 Difficulty in walking, not elsewhere classified: Secondary | ICD-10-CM | POA: Diagnosis not present

## 2019-11-29 DIAGNOSIS — M6281 Muscle weakness (generalized): Secondary | ICD-10-CM | POA: Diagnosis not present

## 2019-11-29 DIAGNOSIS — R262 Difficulty in walking, not elsewhere classified: Secondary | ICD-10-CM | POA: Diagnosis not present

## 2019-11-29 DIAGNOSIS — Z9181 History of falling: Secondary | ICD-10-CM | POA: Diagnosis not present

## 2019-12-01 DIAGNOSIS — Z1331 Encounter for screening for depression: Secondary | ICD-10-CM | POA: Diagnosis not present

## 2019-12-02 DIAGNOSIS — N3946 Mixed incontinence: Secondary | ICD-10-CM | POA: Diagnosis not present

## 2019-12-02 DIAGNOSIS — M25511 Pain in right shoulder: Secondary | ICD-10-CM | POA: Diagnosis not present

## 2019-12-02 DIAGNOSIS — Z9181 History of falling: Secondary | ICD-10-CM | POA: Diagnosis not present

## 2019-12-02 DIAGNOSIS — R262 Difficulty in walking, not elsewhere classified: Secondary | ICD-10-CM | POA: Diagnosis not present

## 2019-12-02 DIAGNOSIS — M6281 Muscle weakness (generalized): Secondary | ICD-10-CM | POA: Diagnosis not present

## 2019-12-06 DIAGNOSIS — R262 Difficulty in walking, not elsewhere classified: Secondary | ICD-10-CM | POA: Diagnosis not present

## 2019-12-06 DIAGNOSIS — M6281 Muscle weakness (generalized): Secondary | ICD-10-CM | POA: Diagnosis not present

## 2019-12-06 DIAGNOSIS — N3946 Mixed incontinence: Secondary | ICD-10-CM | POA: Diagnosis not present

## 2019-12-06 DIAGNOSIS — M25511 Pain in right shoulder: Secondary | ICD-10-CM | POA: Diagnosis not present

## 2019-12-06 DIAGNOSIS — Z9181 History of falling: Secondary | ICD-10-CM | POA: Diagnosis not present

## 2019-12-07 DIAGNOSIS — N3946 Mixed incontinence: Secondary | ICD-10-CM | POA: Diagnosis not present

## 2019-12-07 DIAGNOSIS — M6281 Muscle weakness (generalized): Secondary | ICD-10-CM | POA: Diagnosis not present

## 2019-12-07 DIAGNOSIS — M25511 Pain in right shoulder: Secondary | ICD-10-CM | POA: Diagnosis not present

## 2019-12-07 DIAGNOSIS — R262 Difficulty in walking, not elsewhere classified: Secondary | ICD-10-CM | POA: Diagnosis not present

## 2019-12-07 DIAGNOSIS — Z9181 History of falling: Secondary | ICD-10-CM | POA: Diagnosis not present

## 2019-12-08 DIAGNOSIS — R262 Difficulty in walking, not elsewhere classified: Secondary | ICD-10-CM | POA: Diagnosis not present

## 2019-12-08 DIAGNOSIS — M25511 Pain in right shoulder: Secondary | ICD-10-CM | POA: Diagnosis not present

## 2019-12-08 DIAGNOSIS — N3946 Mixed incontinence: Secondary | ICD-10-CM | POA: Diagnosis not present

## 2019-12-08 DIAGNOSIS — M6281 Muscle weakness (generalized): Secondary | ICD-10-CM | POA: Diagnosis not present

## 2019-12-08 DIAGNOSIS — Z9181 History of falling: Secondary | ICD-10-CM | POA: Diagnosis not present

## 2019-12-13 DIAGNOSIS — Z9181 History of falling: Secondary | ICD-10-CM | POA: Diagnosis not present

## 2019-12-13 DIAGNOSIS — M25511 Pain in right shoulder: Secondary | ICD-10-CM | POA: Diagnosis not present

## 2019-12-13 DIAGNOSIS — M6281 Muscle weakness (generalized): Secondary | ICD-10-CM | POA: Diagnosis not present

## 2019-12-13 DIAGNOSIS — R262 Difficulty in walking, not elsewhere classified: Secondary | ICD-10-CM | POA: Diagnosis not present

## 2019-12-13 DIAGNOSIS — N3946 Mixed incontinence: Secondary | ICD-10-CM | POA: Diagnosis not present

## 2019-12-14 DIAGNOSIS — M6281 Muscle weakness (generalized): Secondary | ICD-10-CM | POA: Diagnosis not present

## 2019-12-14 DIAGNOSIS — N3946 Mixed incontinence: Secondary | ICD-10-CM | POA: Diagnosis not present

## 2019-12-14 DIAGNOSIS — R262 Difficulty in walking, not elsewhere classified: Secondary | ICD-10-CM | POA: Diagnosis not present

## 2019-12-14 DIAGNOSIS — M25511 Pain in right shoulder: Secondary | ICD-10-CM | POA: Diagnosis not present

## 2019-12-14 DIAGNOSIS — Z9181 History of falling: Secondary | ICD-10-CM | POA: Diagnosis not present

## 2019-12-16 DIAGNOSIS — M6281 Muscle weakness (generalized): Secondary | ICD-10-CM | POA: Diagnosis not present

## 2019-12-16 DIAGNOSIS — N3946 Mixed incontinence: Secondary | ICD-10-CM | POA: Diagnosis not present

## 2019-12-16 DIAGNOSIS — Z9181 History of falling: Secondary | ICD-10-CM | POA: Diagnosis not present

## 2019-12-16 DIAGNOSIS — R262 Difficulty in walking, not elsewhere classified: Secondary | ICD-10-CM | POA: Diagnosis not present

## 2019-12-16 DIAGNOSIS — M25511 Pain in right shoulder: Secondary | ICD-10-CM | POA: Diagnosis not present

## 2019-12-21 DIAGNOSIS — Z9181 History of falling: Secondary | ICD-10-CM | POA: Diagnosis not present

## 2019-12-21 DIAGNOSIS — M6281 Muscle weakness (generalized): Secondary | ICD-10-CM | POA: Diagnosis not present

## 2019-12-21 DIAGNOSIS — R262 Difficulty in walking, not elsewhere classified: Secondary | ICD-10-CM | POA: Diagnosis not present

## 2019-12-21 DIAGNOSIS — M25511 Pain in right shoulder: Secondary | ICD-10-CM | POA: Diagnosis not present

## 2019-12-21 DIAGNOSIS — N3946 Mixed incontinence: Secondary | ICD-10-CM | POA: Diagnosis not present

## 2019-12-22 DIAGNOSIS — M6281 Muscle weakness (generalized): Secondary | ICD-10-CM | POA: Diagnosis not present

## 2019-12-22 DIAGNOSIS — Z9181 History of falling: Secondary | ICD-10-CM | POA: Diagnosis not present

## 2019-12-22 DIAGNOSIS — R262 Difficulty in walking, not elsewhere classified: Secondary | ICD-10-CM | POA: Diagnosis not present

## 2019-12-22 DIAGNOSIS — N3946 Mixed incontinence: Secondary | ICD-10-CM | POA: Diagnosis not present

## 2019-12-22 DIAGNOSIS — M25511 Pain in right shoulder: Secondary | ICD-10-CM | POA: Diagnosis not present

## 2019-12-23 DIAGNOSIS — Z9181 History of falling: Secondary | ICD-10-CM | POA: Diagnosis not present

## 2019-12-23 DIAGNOSIS — R262 Difficulty in walking, not elsewhere classified: Secondary | ICD-10-CM | POA: Diagnosis not present

## 2019-12-23 DIAGNOSIS — N3946 Mixed incontinence: Secondary | ICD-10-CM | POA: Diagnosis not present

## 2019-12-23 DIAGNOSIS — M6281 Muscle weakness (generalized): Secondary | ICD-10-CM | POA: Diagnosis not present

## 2019-12-23 DIAGNOSIS — M25511 Pain in right shoulder: Secondary | ICD-10-CM | POA: Diagnosis not present

## 2019-12-27 DIAGNOSIS — M6281 Muscle weakness (generalized): Secondary | ICD-10-CM | POA: Diagnosis not present

## 2019-12-27 DIAGNOSIS — N3946 Mixed incontinence: Secondary | ICD-10-CM | POA: Diagnosis not present

## 2019-12-27 DIAGNOSIS — R262 Difficulty in walking, not elsewhere classified: Secondary | ICD-10-CM | POA: Diagnosis not present

## 2019-12-27 DIAGNOSIS — Z9181 History of falling: Secondary | ICD-10-CM | POA: Diagnosis not present

## 2019-12-27 DIAGNOSIS — M25511 Pain in right shoulder: Secondary | ICD-10-CM | POA: Diagnosis not present

## 2019-12-29 DIAGNOSIS — Z9181 History of falling: Secondary | ICD-10-CM | POA: Diagnosis not present

## 2019-12-29 DIAGNOSIS — R262 Difficulty in walking, not elsewhere classified: Secondary | ICD-10-CM | POA: Diagnosis not present

## 2019-12-29 DIAGNOSIS — N3946 Mixed incontinence: Secondary | ICD-10-CM | POA: Diagnosis not present

## 2019-12-29 DIAGNOSIS — M6281 Muscle weakness (generalized): Secondary | ICD-10-CM | POA: Diagnosis not present

## 2019-12-29 DIAGNOSIS — M25511 Pain in right shoulder: Secondary | ICD-10-CM | POA: Diagnosis not present

## 2019-12-30 DIAGNOSIS — Z9181 History of falling: Secondary | ICD-10-CM | POA: Diagnosis not present

## 2019-12-30 DIAGNOSIS — M6281 Muscle weakness (generalized): Secondary | ICD-10-CM | POA: Diagnosis not present

## 2019-12-30 DIAGNOSIS — N3946 Mixed incontinence: Secondary | ICD-10-CM | POA: Diagnosis not present

## 2019-12-30 DIAGNOSIS — M25511 Pain in right shoulder: Secondary | ICD-10-CM | POA: Diagnosis not present

## 2019-12-30 DIAGNOSIS — R262 Difficulty in walking, not elsewhere classified: Secondary | ICD-10-CM | POA: Diagnosis not present

## 2020-01-04 DIAGNOSIS — Z9181 History of falling: Secondary | ICD-10-CM | POA: Diagnosis not present

## 2020-01-04 DIAGNOSIS — M25511 Pain in right shoulder: Secondary | ICD-10-CM | POA: Diagnosis not present

## 2020-01-04 DIAGNOSIS — N3946 Mixed incontinence: Secondary | ICD-10-CM | POA: Diagnosis not present

## 2020-01-04 DIAGNOSIS — M6281 Muscle weakness (generalized): Secondary | ICD-10-CM | POA: Diagnosis not present

## 2020-01-05 DIAGNOSIS — M6281 Muscle weakness (generalized): Secondary | ICD-10-CM | POA: Diagnosis not present

## 2020-01-05 DIAGNOSIS — N3946 Mixed incontinence: Secondary | ICD-10-CM | POA: Diagnosis not present

## 2020-01-05 DIAGNOSIS — M25511 Pain in right shoulder: Secondary | ICD-10-CM | POA: Diagnosis not present

## 2020-01-05 DIAGNOSIS — Z9181 History of falling: Secondary | ICD-10-CM | POA: Diagnosis not present

## 2020-01-06 DIAGNOSIS — N3946 Mixed incontinence: Secondary | ICD-10-CM | POA: Diagnosis not present

## 2020-01-06 DIAGNOSIS — Z9181 History of falling: Secondary | ICD-10-CM | POA: Diagnosis not present

## 2020-01-06 DIAGNOSIS — M6281 Muscle weakness (generalized): Secondary | ICD-10-CM | POA: Diagnosis not present

## 2020-01-06 DIAGNOSIS — M25511 Pain in right shoulder: Secondary | ICD-10-CM | POA: Diagnosis not present

## 2020-01-11 DIAGNOSIS — Z9181 History of falling: Secondary | ICD-10-CM | POA: Diagnosis not present

## 2020-01-11 DIAGNOSIS — M25511 Pain in right shoulder: Secondary | ICD-10-CM | POA: Diagnosis not present

## 2020-01-11 DIAGNOSIS — N3946 Mixed incontinence: Secondary | ICD-10-CM | POA: Diagnosis not present

## 2020-01-11 DIAGNOSIS — M6281 Muscle weakness (generalized): Secondary | ICD-10-CM | POA: Diagnosis not present

## 2020-01-12 DIAGNOSIS — Z9181 History of falling: Secondary | ICD-10-CM | POA: Diagnosis not present

## 2020-01-12 DIAGNOSIS — N3946 Mixed incontinence: Secondary | ICD-10-CM | POA: Diagnosis not present

## 2020-01-12 DIAGNOSIS — M25511 Pain in right shoulder: Secondary | ICD-10-CM | POA: Diagnosis not present

## 2020-01-12 DIAGNOSIS — M6281 Muscle weakness (generalized): Secondary | ICD-10-CM | POA: Diagnosis not present

## 2020-01-14 DIAGNOSIS — M6281 Muscle weakness (generalized): Secondary | ICD-10-CM | POA: Diagnosis not present

## 2020-01-14 DIAGNOSIS — Z9181 History of falling: Secondary | ICD-10-CM | POA: Diagnosis not present

## 2020-01-14 DIAGNOSIS — N3946 Mixed incontinence: Secondary | ICD-10-CM | POA: Diagnosis not present

## 2020-01-14 DIAGNOSIS — M25511 Pain in right shoulder: Secondary | ICD-10-CM | POA: Diagnosis not present

## 2020-01-17 DIAGNOSIS — M6281 Muscle weakness (generalized): Secondary | ICD-10-CM | POA: Diagnosis not present

## 2020-01-17 DIAGNOSIS — N3946 Mixed incontinence: Secondary | ICD-10-CM | POA: Diagnosis not present

## 2020-01-17 DIAGNOSIS — Z9181 History of falling: Secondary | ICD-10-CM | POA: Diagnosis not present

## 2020-01-17 DIAGNOSIS — M25511 Pain in right shoulder: Secondary | ICD-10-CM | POA: Diagnosis not present

## 2020-01-18 DIAGNOSIS — M25511 Pain in right shoulder: Secondary | ICD-10-CM | POA: Diagnosis not present

## 2020-01-18 DIAGNOSIS — N3946 Mixed incontinence: Secondary | ICD-10-CM | POA: Diagnosis not present

## 2020-01-18 DIAGNOSIS — Z9181 History of falling: Secondary | ICD-10-CM | POA: Diagnosis not present

## 2020-01-18 DIAGNOSIS — M6281 Muscle weakness (generalized): Secondary | ICD-10-CM | POA: Diagnosis not present

## 2020-01-24 DIAGNOSIS — N3946 Mixed incontinence: Secondary | ICD-10-CM | POA: Diagnosis not present

## 2020-01-24 DIAGNOSIS — Z9181 History of falling: Secondary | ICD-10-CM | POA: Diagnosis not present

## 2020-01-24 DIAGNOSIS — M6281 Muscle weakness (generalized): Secondary | ICD-10-CM | POA: Diagnosis not present

## 2020-01-24 DIAGNOSIS — M25511 Pain in right shoulder: Secondary | ICD-10-CM | POA: Diagnosis not present

## 2020-01-25 DIAGNOSIS — M25511 Pain in right shoulder: Secondary | ICD-10-CM | POA: Diagnosis not present

## 2020-01-25 DIAGNOSIS — Z9181 History of falling: Secondary | ICD-10-CM | POA: Diagnosis not present

## 2020-01-25 DIAGNOSIS — M6281 Muscle weakness (generalized): Secondary | ICD-10-CM | POA: Diagnosis not present

## 2020-01-25 DIAGNOSIS — N3946 Mixed incontinence: Secondary | ICD-10-CM | POA: Diagnosis not present

## 2020-02-19 ENCOUNTER — Other Ambulatory Visit: Payer: Self-pay | Admitting: Cardiology

## 2020-03-09 ENCOUNTER — Other Ambulatory Visit: Payer: Self-pay | Admitting: Cardiology

## 2020-04-03 ENCOUNTER — Ambulatory Visit (INDEPENDENT_AMBULATORY_CARE_PROVIDER_SITE_OTHER): Payer: Medicare Other | Admitting: Cardiology

## 2020-04-03 ENCOUNTER — Encounter: Payer: Self-pay | Admitting: Cardiology

## 2020-04-03 ENCOUNTER — Other Ambulatory Visit: Payer: Self-pay

## 2020-04-03 VITALS — BP 104/62 | HR 68 | Ht 59.0 in | Wt 131.2 lb

## 2020-04-03 DIAGNOSIS — I5042 Chronic combined systolic (congestive) and diastolic (congestive) heart failure: Secondary | ICD-10-CM | POA: Diagnosis not present

## 2020-04-03 DIAGNOSIS — E785 Hyperlipidemia, unspecified: Secondary | ICD-10-CM | POA: Diagnosis not present

## 2020-04-03 DIAGNOSIS — I255 Ischemic cardiomyopathy: Secondary | ICD-10-CM | POA: Diagnosis not present

## 2020-04-03 DIAGNOSIS — I447 Left bundle-branch block, unspecified: Secondary | ICD-10-CM | POA: Diagnosis not present

## 2020-04-03 DIAGNOSIS — I2510 Atherosclerotic heart disease of native coronary artery without angina pectoris: Secondary | ICD-10-CM

## 2020-04-03 DIAGNOSIS — Z9861 Coronary angioplasty status: Secondary | ICD-10-CM

## 2020-04-03 MED ORDER — FUROSEMIDE 20 MG PO TABS
ORAL_TABLET | ORAL | 3 refills | Status: AC
Start: 2020-04-03 — End: ?

## 2020-04-03 NOTE — Patient Instructions (Signed)
Medication Instructions:  Stop taking clopidogrel 75 mg   Start taking Aspirin 81 mg one tablet daily    wean off carvedilol  3.125 mg  -  take every other day  For one week  Then stop taking.  Decrease furosemide to 20 mg daily  ( okay to take  The 2nd dose if Swelling increase)   *If you need a refill on your cardiac medications before your next appointment, please call your pharmacy*   Lab Work: Not needed    Testing/Procedures: Not needed   Follow-Up: At [facility], you and your health needs are our priority.  As part of our continuing mission to provide you with exceptional heart care, we have created designated Provider Care Teams.  These Care Teams include your primary Cardiologist (physician) and Advanced Practice Providers (APPs -  Physician Assistants and Nurse Practitioners) who all work together to provide you with the care you need, when you need it.      Your next appointment:   12 month(s)  The format for your next appointment:   In Person  Provider:   Glenetta Hew, MD   Other Instructions

## 2020-04-03 NOTE — Progress Notes (Signed)
Primary Care Provider: Prince Solian, MD Cardiologist: Glenetta Hew, MD Electrophysiologist: None  Clinic Note: Chief Complaint  Patient presents with   Follow-up    Doing pretty well.  Just slowing down; unsteady/wobbly gait   Coronary Artery Disease    No chest pain    HPI:    Leah Trujillo is a 86 y.o. female with a PMH notable for CAD PCI of LAD October 2017 who presents today for 40-month follow-up.  CAD History:  Initial consult 06/24/2016-worsening exertional dyspnea-simply walking across the street.  (LBBB noted on EKG with frequent PVCs) ? Echo: EF 35 to 40% diffuse HK.  Moderate pulmonary pretension. ->  Referred for cath ? Cath 07/09/16: Proximal LAD FFR positive lesion-DES PCI with severely elevated LVEDP.  April 2019 she suffered a fall with right pelvic and humerus fracture  Leah Trujillo was last seen on September 17, 2019 via telemedicine.  No complaints.  Just had dealt with a pneumonia prior to Christmas (ruled out for Covid).  PCP had stopped losartan and she stopped taking pravastatin (on rosuvastatin).  Mild on today swelling, but no PND or orthopnea.  Was doing PT for balance issues and weakness.  Enjoys exercise.  No chest pain or pressure.  Some unsteady gait and dizziness.  Recent Hospitalizations: None  Reviewed  CV studies:    The following studies were reviewed today: (if available, images/films reviewed: From Epic Chart or Care Everywhere)  None:  She is accompanied by one of her daughters.  Interval History:   Leah Trujillo presents here for her first in person visit since I saw her back in December 2018.  She was seen twice in telemedicine. She really notes that she is still having to work with physical therapy quite a bit.  She is gradually building up her strength but is still little bit "wobbly.  When she does a lot of the physical therapy she may get little short of breath, but also indicates that she got very deconditioned  over the last months prior to her pneumonia and then was convalescing for a while. She does use a cane around the house but has a new walker that she uses for longer distance walking.  She has had a few falls in the last several months mostly because her balance is just not as good as it used to be and she feels as though she is a hard time picking up her feet.  She denies any chest pain with ambulation.  No PND, or orthopnea with trivial end of day ankle edema --> edema is usually controlled with her once a day Lasix..  She says that she often wakes up to go to the bathroom and has a hard time going back to sleep.  She has no sensations of rapid irregular heartbeats or palpitations.  CV Review of Symptoms (Summary) Cardiovascular ROS: positive for - edema and Some exertional dyspnea related to deconditioning. negative for - chest pain, irregular heartbeat, orthopnea, palpitations, paroxysmal nocturnal dyspnea, rapid heart rate or Syncope/near syncope, TIA/amaurosis fugax, claudication  The patient does not have symptoms concerning for COVID-19 infection (fever, chills, cough, or new shortness of breath).  The patient is practicing social distancing & Masking.   She has had her 2 COVID-19 vaccine injections.  REVIEWED OF SYSTEMS   Review of Systems  Constitutional: Negative for chills, fever, malaise/fatigue (Gradually getting energy back.) and weight loss.  HENT: Negative for congestion and nosebleeds.   Respiratory: Positive for cough and  wheezing. Negative for sputum production.   Gastrointestinal: Negative for abdominal pain, blood in stool, constipation, diarrhea and melena.  Genitourinary: Negative for hematuria.  Musculoskeletal: Positive for falls and joint pain.       Noted in HPI  Neurological: Positive for weakness (Her legs are not as strong as they were.  Feels "wobbly "). Negative for dizziness.       Somewhat unsteady gait.  She describes it as "wobbly "    Psychiatric/Behavioral: Positive for memory loss. Negative for depression. The patient has insomnia (More about having a hard time going back to sleep). The patient is not nervous/anxious.    I have reviewed and (if needed) personally updated the patient's problem list, medications, allergies, past medical and surgical history, social and family history.   PAST MEDICAL HISTORY   Past Medical History:  Diagnosis Date   CAD S/P percutaneous coronary angioplasty 07/09/2016   a. Cath: LM nl, pLAD 75% (FFR 0.79 -> 2.25 x 12 Synergy DES), mLAD 55%, ost D1 50%, LCX nl, OM1nl, RCA large, min irregs, EF 35%, EDP 25-39mmHg.   Chronic radicular pain of lower back    Chronic systolic CHF (congestive heart failure) ([place])    a. 05/2016 Echo: EF 35-40%, diff HK, Gr1 DD.   Complication of anesthesia    "takes alot to put me down" (07/09/2016)   DJD (degenerative joint disease), thoracolumbar    Chronic back pain but also neck and knees   Dysthymia    Well-controlled on Celexa   Family history of adverse reaction to anesthesia    "takes alot to put my family down" (07/09/2016)   GERD (gastroesophageal reflux disease)    Headache    "pretty often" (07/09/2016)   High cholesterol    History of kidney stones    Hypertension    Hypothyroidism (acquired)    On thyroid replacement   Impaired fasting glucose    He will A1c 5.7 from August 2017   Ischemic cardiomyopathy; With some non-ischemic component    a. 05/2016 Echo: EF 35-40%.; PCI of pLAD with DES.   LBBB (left bundle branch block)    OAB (overactive bladder)     PAST SURGICAL HISTORY   Past Surgical History:  Procedure Laterality Date   CARDIAC CATHETERIZATION N/A 07/09/2016   Procedure: Left Heart Cath and Coronary Angiography;  Surgeon: Leonie Man, MD;  Location: [facility];  Service: Cardiovascular: LM nl, pLAD 75% (FFR 0.79 -> 2.25 x 12 Synergy DES), mLAD 55%, ost D1 50%, LCX nl, OM1nl, RCA large, min irregs,  EF 35%, EDP 25-66mmHg.   CARDIAC CATHETERIZATION N/A 07/09/2016   Procedure: Intravascular Pressure Wire/FFR Study;  Surgeon: Leonie Man, MD;  Location: [facility];  Service: Cardiovascular: pLAD R: 0.79   CARDIAC CATHETERIZATION N/A 07/09/2016   Procedure: Coronary Stent Intervention;  Surgeon: [facility]ie Man, MD;  Location: [facility];  Service: Cardiovascular: pLAD (@D1 ) PCI with SYNERGY DES 2.25 X 12 (post-dialted to 2.6 mm)   CATARACT EXTRACTION W/ INTRAOCULAR LENS  IMPLANT, BILATERAL Bilateral    CORNEAL TRANSPLANT  1970-2013   "3 in right eye; 1 in left eye"   CORONARY ANGIOPLASTY WITH STENT PLACEMENT  07/09/2016   EYE SURGERY     KIDNEY STONE SURGERY  1980   "cut me 1/2 way open"   TONSILLECTOMY     TRANSTHORACIC ECHOCARDIOGRAM  05/17/2016   Normal LV size. Moderate-severely reduced EF of 35-40% with diffuse HK. Only GR 1  DD noted.  Abdomen normal, dyssynchronous septal motion secondary to LBBB. Moderate TR with PA pressures estimated at 44 mmHg. (Moderate pulmonary hypertension.   TRANSTHORACIC ECHOCARDIOGRAM  10/15/2016   Post PCI: EF 45-50%. Still has some mild mid to apical anteroseptal hypokinesis. GR 1 DD. Mild septal dyssynergy related to the. Mild MR, mild TR. Mild LAE and mildly elevated pulmonary pressures.    MEDICATIONS/ALLERGIES   Current Meds  Medication Sig   acetaminophen (TYLENOL) 325 MG tablet Take 650 mg by mouth every 6 (six) hours as needed.   aspirin 81 MG tablet Take 81 mg by mouth daily.   citalopram (CELEXA) 10 MG tablet Take 10 mg by mouth daily.   desonide (DESOWEN) 0.05 % cream Apply 1 application topically daily. Applies to ears   diclofenac Sodium (VOLTAREN) 1 % GEL Apply 2 g topically as needed.   levothyroxine (SYNTHROID) 75 MCG tablet Take 75 mcg by mouth daily before breakfast.   LORazepam (ATIVAN) 0.5 MG tablet Take 0.5 mg by mouth at bedtime.   Melatonin 10 MG TABS One qhs   naproxen sodium (ANAPROX) 220  MG tablet Take 220 mg by mouth 2 (two) times daily as needed.    nitroGLYCERIN (NITROSTAT) 0.4 MG SL tablet Place 1 tablet (0.4 mg total) under the tongue every 5 (five) minutes as needed for chest pain.   omeprazole (PRILOSEC) 20 MG capsule Take by mouth 2 (two) times daily before a meal.    Polyethyl Glycol-Propyl Glycol (SYSTANE OP) Place 1 drop into both eyes every 4 (four) hours as needed (dry eyes).   prednisoLONE acetate (PRED FORTE) 1 % ophthalmic suspension Place 1 drop into both eyes daily.   rosuvastatin (CRESTOR) 20 MG tablet Take by mouth.   [DISCONTINUED] carvedilol (COREG) 3.125 MG tablet TAKE 1 TABLET BY MOUTH TWICE A DAY WITH MEALS   [DISCONTINUED] clopidogrel (PLAVIX) 75 MG tablet TAKE 1 TABLET BY MOUTH EVERY DAY   [DISCONTINUED] furosemide (LASIX) 40 MG tablet TAKE 1 TABLET BY MOUTH EVERY DAY   [DISCONTINUED] loratadine (CLARITIN) 10 MG tablet Take 10 mg by mouth daily as needed.     Allergies  Allergen Reactions   Codeine Rash    SOCIAL HISTORY/FAMILY HISTORY   Reviewed in Epic:  Pertinent findings: None  OBJCTIVE -PE, EKG, labs   Wt Readings from Last 3 Encounters:  04/03/20 131 lb 3.2 oz (59.5 kg)  09/17/19 130 lb (59 kg)  12/21/18 107 lb 3.2 oz (48.6 kg)    Physical Exam: BP (!) 104/62    Pulse 68    Ht 4\' 11"  (1.499 m)    Wt 131 lb 3.2 oz (59.5 kg)    SpO2 98%    BMI 26.50 kg/m  Physical Exam Vitals reviewed.  Constitutional:      General: She is not in acute distress.    Appearance: Normal appearance. She is normal weight. She is not ill-appearing.     Comments: Pleasant elderly woman.  Well-groomed.  HENT:     Head: Normocephalic and atraumatic.  Neck:     Vascular: No carotid bruit, hepatojugular reflux or JVD.  Cardiovascular:     Rate and Rhythm: Normal rate and regular rhythm.  No extrasystoles are present.    Pulses: Normal pulses.     Heart sounds: Normal heart sounds. No murmur heard.  No friction rub. No gallop.   Pulmonary:       Effort: Pulmonary effort is normal. No respiratory distress.     Breath sounds: Wheezing (Mild,  faint end-expiratory wheeze-right-sided) present. No rhonchi or rales.  Chest:     Chest wall: No tenderness.  Musculoskeletal:        General: Swelling (Trivial) present. Normal range of motion.     Cervical back: Normal range of motion.  Neurological:     General: No focal deficit present.     Mental Status: She is alert and oriented to person, place, and time.  Psychiatric:        Mood and Affect: Mood normal.        Behavior: Behavior normal.        Thought Content: Thought content normal.        Judgment: Judgment normal.     Adult ECG Report  Rate: 68 ;  Rhythm: normal sinus rhythm and Left axis deviation, left bundle branch block.  Otherwise normal intervals and durations.;   Narrative Interpretation: Stable EKG  Recent Labs: Most recent cholesterol panel was from July 02, 2019: TC 139, TG 166, HDL 41, LDL 65. ;  A1c as of March 2021 was 5.6. Lab Results  Component Value Date   CHOL 186 07/17/2016   HDL 48 (L) 07/17/2016   LDLCALC 104 (H) 07/17/2016   TRIG 168 (H) 07/17/2016   CHOLHDL 3.9 07/17/2016   Lab Results  Component Value Date   CREATININE 1.13 (H) 08/23/2016   BUN 15 08/23/2016   NA 138 08/23/2016   K 3.5 08/23/2016   CL 103 08/23/2016   CO2 29 08/23/2016   No results found for: TSH -> 07/02/2019: 0.98  ASSESSMENT/PLAN    Problem List Items Addressed This Visit    CAD S/P LAD PCI - Primary (Chronic)    Overall doing better after his LAD PCI.  This did improve her dyspnea.  She does still have some chronic dyspnea but doing well.   No anginal symptoms.  Her major symptom was exertional dyspnea as anginal component. Plan:   Continue aspirin, but discontinue clopidogrel.  Continue rosuvastatin.  With her unsteady gait, I worry about low blood pressures.  She apparently is not even taking her carvedilol twice daily on most days.  Often forgets the  second dose.  We will have her wean off of carvedilol over the next week.       Relevant Medications   furosemide (LASIX) 20 MG tablet   Other Relevant Orders   EKG 12-Lead (Completed)   Chronic combined systolic and diastolic CHF, NYHA class 2 (HCC) (Chronic)    Mildly reduced EF with elevated EDP on cath and echo.  She does have some trivial edema, no PND orthopnea.  I do not think she is having any more than class I symptoms of heart failure.  Exertional dyspnea is probably is more related to deconditioning.  We had to stop the losartan because of hypotension.  And on weaning off of carvedilol for the same reason. She has minimal edema I think we would probably get away with cutting her Lasix dose to 20 mg daily dose an additional 20 mg as needed.      Relevant Medications   furosemide (LASIX) 20 MG tablet   Left bundle branch block (Chronic)    I suspect that the left bundle branch block may be partly the reason for her reduced EF.  If symptoms worsen, we could consider rechecking echocardiogram, for now she is stable.      Relevant Medications   furosemide (LASIX) 20 MG tablet   Dyslipidemia, goal LDL below 70 (Chronic)  She should be due to get her labs checked in October with routine PCP follow-up.  Last labs showed excellent control.  She is on rosuvastatin as opposed to pravastatin.  No myalgias...      Relevant Medications   furosemide (LASIX) 20 MG tablet       COVID-19 Education: The signs and symptoms of COVID-19 were discussed with the patient and how to seek care for testing (follow up with PCP or arrange E-visit).   The importance of social distancing and COVID-19 vaccination was discussed today.  I spent a total of 19 minutes with the patient. >  50% of the time was spent in direct patient consultation.  Additional time spent with chart review  / charting (studies, outside notes, etc): 6 Total Time: 25 min   Current medicines are reviewed at length with  the patient today.  (+/- concerns) none  Notice: This dictation was prepared with Dragon dictation along with smaller phrase technology. Any transcriptional errors that result from this process are unintentional and may not be corrected upon review.  Patient Instructions / Medication Changes & Studies & Tests Ordered   Patient Instructions  Medication Instructions:  Stop taking clopidogrel 75 mg   Start taking Aspirin 81 mg one tablet daily    wean off carvedilol  3.125 mg  -  take every other day  For one week  Then stop taking.  Decrease furosemide to 20 mg daily  ( okay to take  The 2nd dose if Swelling increase)   *If you need a refill on your cardiac medications before your next appointment, please call your pharmacy*   Lab Work: Not needed    Testing/Procedures: Not needed   Follow-Up: At [facility], you and your health needs are our priority.  As part of our continuing mission to provide you with exceptional heart care, we have created designated Provider Care Teams.  These Care Teams include your primary Cardiologist (physician) and Advanced Practice Providers (APPs -  Physician Assistants and Nurse Practitioners) who all work together to provide you with the care you need, when you need it.      Your next appointment:   12 month(s)  The format for your next appointment:   In Person  Provider:   Glenetta Hew, MD   Other Instructions     Studies Ordered:   Orders Placed This Encounter  Procedures   EKG 12-Lead     Glenetta Hew, M.D., M.S. Interventional Cardiologist   Pager # [phone] Phone # [phone] [address] [place], [place] [address]   Thank you for choosing Heartcare at [facility]!!

## 2020-04-09 ENCOUNTER — Encounter: Payer: Self-pay | Admitting: Cardiology

## 2020-04-09 NOTE — Assessment & Plan Note (Signed)
She should be due to get her labs checked in October with routine PCP follow-up.  Last labs showed excellent control.  She is on rosuvastatin as opposed to pravastatin.  No myalgias.Marland KitchenMarland Kitchen

## 2020-04-09 NOTE — Assessment & Plan Note (Signed)
I suspect that the left bundle branch block may be partly the reason for her reduced EF.  If symptoms worsen, we could consider rechecking echocardiogram, for now she is stable.

## 2020-04-09 NOTE — Assessment & Plan Note (Signed)
Overall doing better after his LAD PCI.  This did improve her dyspnea.  She does still have some chronic dyspnea but doing well.   No anginal symptoms.  Her major symptom was exertional dyspnea as anginal component. Plan:   Continue aspirin, but discontinue clopidogrel.  Continue rosuvastatin.  With her unsteady gait, I worry about low blood pressures.  She apparently is not even taking her carvedilol twice daily on most days.  Often forgets the second dose.  We will have her wean off of carvedilol over the next week.

## 2020-04-09 NOTE — Assessment & Plan Note (Signed)
Mildly reduced EF with elevated EDP on cath and echo.  She does have some trivial edema, no PND orthopnea.  I do not think she is having any more than class I symptoms of heart failure.  Exertional dyspnea is probably is more related to deconditioning.  We had to stop the losartan because of hypotension.  And on weaning off of carvedilol for the same reason. She has minimal edema I think we would probably get away with cutting her Lasix dose to 20 mg daily dose an additional 20 mg as needed.

## 2020-04-18 DIAGNOSIS — R296 Repeated falls: Secondary | ICD-10-CM | POA: Diagnosis not present

## 2020-04-18 DIAGNOSIS — R413 Other amnesia: Secondary | ICD-10-CM | POA: Diagnosis not present

## 2020-04-18 DIAGNOSIS — N390 Urinary tract infection, site not specified: Secondary | ICD-10-CM | POA: Diagnosis not present

## 2020-04-28 DIAGNOSIS — Z111 Encounter for screening for respiratory tuberculosis: Secondary | ICD-10-CM | POA: Diagnosis not present

## 2020-04-30 DIAGNOSIS — Z111 Encounter for screening for respiratory tuberculosis: Secondary | ICD-10-CM | POA: Diagnosis not present

## 2020-05-02 DIAGNOSIS — M6281 Muscle weakness (generalized): Secondary | ICD-10-CM | POA: Diagnosis not present

## 2020-05-02 DIAGNOSIS — F064 Anxiety disorder due to known physiological condition: Secondary | ICD-10-CM | POA: Diagnosis not present

## 2020-05-02 DIAGNOSIS — F331 Major depressive disorder, recurrent, moderate: Secondary | ICD-10-CM | POA: Diagnosis not present

## 2020-05-02 DIAGNOSIS — G4701 Insomnia due to medical condition: Secondary | ICD-10-CM | POA: Diagnosis not present

## 2020-05-02 DIAGNOSIS — R278 Other lack of coordination: Secondary | ICD-10-CM | POA: Diagnosis not present

## 2020-05-02 DIAGNOSIS — F0391 Unspecified dementia with behavioral disturbance: Secondary | ICD-10-CM | POA: Diagnosis not present

## 2020-05-03 DIAGNOSIS — R278 Other lack of coordination: Secondary | ICD-10-CM | POA: Diagnosis not present

## 2020-05-03 DIAGNOSIS — M6281 Muscle weakness (generalized): Secondary | ICD-10-CM | POA: Diagnosis not present

## 2020-05-04 DIAGNOSIS — R2681 Unsteadiness on feet: Secondary | ICD-10-CM | POA: Diagnosis not present

## 2020-05-04 DIAGNOSIS — R2689 Other abnormalities of gait and mobility: Secondary | ICD-10-CM | POA: Diagnosis not present

## 2020-05-05 DIAGNOSIS — M6281 Muscle weakness (generalized): Secondary | ICD-10-CM | POA: Diagnosis not present

## 2020-05-05 DIAGNOSIS — R2689 Other abnormalities of gait and mobility: Secondary | ICD-10-CM | POA: Diagnosis not present

## 2020-05-05 DIAGNOSIS — R2681 Unsteadiness on feet: Secondary | ICD-10-CM | POA: Diagnosis not present

## 2020-05-05 DIAGNOSIS — R278 Other lack of coordination: Secondary | ICD-10-CM | POA: Diagnosis not present

## 2020-05-08 DIAGNOSIS — N3281 Overactive bladder: Secondary | ICD-10-CM | POA: Diagnosis not present

## 2020-05-08 DIAGNOSIS — I2510 Atherosclerotic heart disease of native coronary artery without angina pectoris: Secondary | ICD-10-CM | POA: Diagnosis not present

## 2020-05-08 DIAGNOSIS — E039 Hypothyroidism, unspecified: Secondary | ICD-10-CM | POA: Diagnosis not present

## 2020-05-08 DIAGNOSIS — I509 Heart failure, unspecified: Secondary | ICD-10-CM | POA: Diagnosis not present

## 2020-05-08 DIAGNOSIS — K219 Gastro-esophageal reflux disease without esophagitis: Secondary | ICD-10-CM | POA: Diagnosis not present

## 2020-05-08 DIAGNOSIS — M1991 Primary osteoarthritis, unspecified site: Secondary | ICD-10-CM | POA: Diagnosis not present

## 2020-05-08 DIAGNOSIS — E782 Mixed hyperlipidemia: Secondary | ICD-10-CM | POA: Diagnosis not present

## 2020-05-08 DIAGNOSIS — I10 Essential (primary) hypertension: Secondary | ICD-10-CM | POA: Diagnosis not present

## 2020-05-10 DIAGNOSIS — M6281 Muscle weakness (generalized): Secondary | ICD-10-CM | POA: Diagnosis not present

## 2020-05-10 DIAGNOSIS — R2689 Other abnormalities of gait and mobility: Secondary | ICD-10-CM | POA: Diagnosis not present

## 2020-05-10 DIAGNOSIS — R278 Other lack of coordination: Secondary | ICD-10-CM | POA: Diagnosis not present

## 2020-05-10 DIAGNOSIS — R2681 Unsteadiness on feet: Secondary | ICD-10-CM | POA: Diagnosis not present

## 2020-05-11 DIAGNOSIS — E559 Vitamin D deficiency, unspecified: Secondary | ICD-10-CM | POA: Diagnosis not present

## 2020-05-11 DIAGNOSIS — I10 Essential (primary) hypertension: Secondary | ICD-10-CM | POA: Diagnosis not present

## 2020-05-11 DIAGNOSIS — M6281 Muscle weakness (generalized): Secondary | ICD-10-CM | POA: Diagnosis not present

## 2020-05-11 DIAGNOSIS — E119 Type 2 diabetes mellitus without complications: Secondary | ICD-10-CM | POA: Diagnosis not present

## 2020-05-11 DIAGNOSIS — E039 Hypothyroidism, unspecified: Secondary | ICD-10-CM | POA: Diagnosis not present

## 2020-05-11 DIAGNOSIS — R2681 Unsteadiness on feet: Secondary | ICD-10-CM | POA: Diagnosis not present

## 2020-05-11 DIAGNOSIS — R278 Other lack of coordination: Secondary | ICD-10-CM | POA: Diagnosis not present

## 2020-05-11 DIAGNOSIS — R2689 Other abnormalities of gait and mobility: Secondary | ICD-10-CM | POA: Diagnosis not present

## 2020-05-11 DIAGNOSIS — D519 Vitamin B12 deficiency anemia, unspecified: Secondary | ICD-10-CM | POA: Diagnosis not present

## 2020-05-12 DIAGNOSIS — R278 Other lack of coordination: Secondary | ICD-10-CM | POA: Diagnosis not present

## 2020-05-12 DIAGNOSIS — M6281 Muscle weakness (generalized): Secondary | ICD-10-CM | POA: Diagnosis not present

## 2020-05-15 DIAGNOSIS — R2681 Unsteadiness on feet: Secondary | ICD-10-CM | POA: Diagnosis not present

## 2020-05-15 DIAGNOSIS — R2689 Other abnormalities of gait and mobility: Secondary | ICD-10-CM | POA: Diagnosis not present

## 2020-05-16 DIAGNOSIS — G4701 Insomnia due to medical condition: Secondary | ICD-10-CM | POA: Diagnosis not present

## 2020-05-16 DIAGNOSIS — F064 Anxiety disorder due to known physiological condition: Secondary | ICD-10-CM | POA: Diagnosis not present

## 2020-05-16 DIAGNOSIS — F331 Major depressive disorder, recurrent, moderate: Secondary | ICD-10-CM | POA: Diagnosis not present

## 2020-05-16 DIAGNOSIS — F0150 Vascular dementia without behavioral disturbance: Secondary | ICD-10-CM | POA: Diagnosis not present

## 2020-05-17 DIAGNOSIS — R2681 Unsteadiness on feet: Secondary | ICD-10-CM | POA: Diagnosis not present

## 2020-05-17 DIAGNOSIS — M6281 Muscle weakness (generalized): Secondary | ICD-10-CM | POA: Diagnosis not present

## 2020-05-17 DIAGNOSIS — R2689 Other abnormalities of gait and mobility: Secondary | ICD-10-CM | POA: Diagnosis not present

## 2020-05-17 DIAGNOSIS — R278 Other lack of coordination: Secondary | ICD-10-CM | POA: Diagnosis not present

## 2020-05-18 DIAGNOSIS — R2689 Other abnormalities of gait and mobility: Secondary | ICD-10-CM | POA: Diagnosis not present

## 2020-05-18 DIAGNOSIS — R278 Other lack of coordination: Secondary | ICD-10-CM | POA: Diagnosis not present

## 2020-05-18 DIAGNOSIS — M6281 Muscle weakness (generalized): Secondary | ICD-10-CM | POA: Diagnosis not present

## 2020-05-18 DIAGNOSIS — R2681 Unsteadiness on feet: Secondary | ICD-10-CM | POA: Diagnosis not present

## 2020-05-19 DIAGNOSIS — R278 Other lack of coordination: Secondary | ICD-10-CM | POA: Diagnosis not present

## 2020-05-19 DIAGNOSIS — R2689 Other abnormalities of gait and mobility: Secondary | ICD-10-CM | POA: Diagnosis not present

## 2020-05-19 DIAGNOSIS — M6281 Muscle weakness (generalized): Secondary | ICD-10-CM | POA: Diagnosis not present

## 2020-05-19 DIAGNOSIS — R2681 Unsteadiness on feet: Secondary | ICD-10-CM | POA: Diagnosis not present

## 2020-06-07 DIAGNOSIS — Z23 Encounter for immunization: Secondary | ICD-10-CM | POA: Diagnosis not present

## 2020-07-10 DIAGNOSIS — R300 Dysuria: Secondary | ICD-10-CM | POA: Diagnosis not present

## 2020-07-18 DIAGNOSIS — G4701 Insomnia due to medical condition: Secondary | ICD-10-CM | POA: Diagnosis not present

## 2020-07-18 DIAGNOSIS — F0150 Vascular dementia without behavioral disturbance: Secondary | ICD-10-CM | POA: Diagnosis not present

## 2020-07-18 DIAGNOSIS — F331 Major depressive disorder, recurrent, moderate: Secondary | ICD-10-CM | POA: Diagnosis not present

## 2020-07-18 DIAGNOSIS — F064 Anxiety disorder due to known physiological condition: Secondary | ICD-10-CM | POA: Diagnosis not present

## 2020-07-20 DIAGNOSIS — Z23 Encounter for immunization: Secondary | ICD-10-CM | POA: Diagnosis not present

## 2020-07-31 DIAGNOSIS — F331 Major depressive disorder, recurrent, moderate: Secondary | ICD-10-CM | POA: Diagnosis not present

## 2020-07-31 DIAGNOSIS — G4701 Insomnia due to medical condition: Secondary | ICD-10-CM | POA: Diagnosis not present

## 2020-07-31 DIAGNOSIS — F064 Anxiety disorder due to known physiological condition: Secondary | ICD-10-CM | POA: Diagnosis not present

## 2020-07-31 DIAGNOSIS — F0150 Vascular dementia without behavioral disturbance: Secondary | ICD-10-CM | POA: Diagnosis not present

## 2020-08-02 DIAGNOSIS — F0150 Vascular dementia without behavioral disturbance: Secondary | ICD-10-CM | POA: Diagnosis not present

## 2020-08-02 DIAGNOSIS — F331 Major depressive disorder, recurrent, moderate: Secondary | ICD-10-CM | POA: Diagnosis not present

## 2020-08-02 DIAGNOSIS — F064 Anxiety disorder due to known physiological condition: Secondary | ICD-10-CM | POA: Diagnosis not present

## 2020-08-02 DIAGNOSIS — G4701 Insomnia due to medical condition: Secondary | ICD-10-CM | POA: Diagnosis not present

## 2020-08-08 DIAGNOSIS — R296 Repeated falls: Secondary | ICD-10-CM | POA: Diagnosis not present

## 2020-08-08 DIAGNOSIS — I5040 Unspecified combined systolic (congestive) and diastolic (congestive) heart failure: Secondary | ICD-10-CM | POA: Diagnosis not present

## 2020-08-08 DIAGNOSIS — I10 Essential (primary) hypertension: Secondary | ICD-10-CM | POA: Diagnosis not present

## 2020-08-08 DIAGNOSIS — F39 Unspecified mood [affective] disorder: Secondary | ICD-10-CM | POA: Diagnosis not present

## 2020-08-08 DIAGNOSIS — E559 Vitamin D deficiency, unspecified: Secondary | ICD-10-CM | POA: Diagnosis not present

## 2020-08-08 DIAGNOSIS — F0390 Unspecified dementia without behavioral disturbance: Secondary | ICD-10-CM | POA: Diagnosis not present

## 2020-08-08 DIAGNOSIS — Z23 Encounter for immunization: Secondary | ICD-10-CM | POA: Diagnosis not present

## 2020-08-08 DIAGNOSIS — Z66 Do not resuscitate: Secondary | ICD-10-CM | POA: Diagnosis not present

## 2020-08-08 DIAGNOSIS — Z79899 Other long term (current) drug therapy: Secondary | ICD-10-CM | POA: Diagnosis not present

## 2020-08-08 DIAGNOSIS — I2510 Atherosclerotic heart disease of native coronary artery without angina pectoris: Secondary | ICD-10-CM | POA: Diagnosis not present

## 2020-08-08 DIAGNOSIS — N390 Urinary tract infection, site not specified: Secondary | ICD-10-CM | POA: Diagnosis not present

## 2020-08-08 DIAGNOSIS — R634 Abnormal weight loss: Secondary | ICD-10-CM | POA: Diagnosis not present

## 2020-08-08 DIAGNOSIS — Z0000 Encounter for general adult medical examination without abnormal findings: Secondary | ICD-10-CM | POA: Diagnosis not present

## 2020-08-08 DIAGNOSIS — E039 Hypothyroidism, unspecified: Secondary | ICD-10-CM | POA: Diagnosis not present

## 2020-08-08 DIAGNOSIS — E785 Hyperlipidemia, unspecified: Secondary | ICD-10-CM | POA: Diagnosis not present

## 2020-08-21 DIAGNOSIS — R0781 Pleurodynia: Secondary | ICD-10-CM | POA: Diagnosis not present

## 2020-08-21 DIAGNOSIS — M25552 Pain in left hip: Secondary | ICD-10-CM | POA: Diagnosis not present

## 2020-08-21 DIAGNOSIS — W19XXXA Unspecified fall, initial encounter: Secondary | ICD-10-CM | POA: Diagnosis not present

## 2020-08-29 DIAGNOSIS — F064 Anxiety disorder due to known physiological condition: Secondary | ICD-10-CM | POA: Diagnosis not present

## 2020-08-29 DIAGNOSIS — G4701 Insomnia due to medical condition: Secondary | ICD-10-CM | POA: Diagnosis not present

## 2020-08-29 DIAGNOSIS — F331 Major depressive disorder, recurrent, moderate: Secondary | ICD-10-CM | POA: Diagnosis not present

## 2020-08-29 DIAGNOSIS — F0150 Vascular dementia without behavioral disturbance: Secondary | ICD-10-CM | POA: Diagnosis not present

## 2020-09-06 DIAGNOSIS — F331 Major depressive disorder, recurrent, moderate: Secondary | ICD-10-CM | POA: Diagnosis not present

## 2020-09-06 DIAGNOSIS — F0150 Vascular dementia without behavioral disturbance: Secondary | ICD-10-CM | POA: Diagnosis not present

## 2020-09-06 DIAGNOSIS — F064 Anxiety disorder due to known physiological condition: Secondary | ICD-10-CM | POA: Diagnosis not present

## 2020-09-06 DIAGNOSIS — G4701 Insomnia due to medical condition: Secondary | ICD-10-CM | POA: Diagnosis not present

## 2020-09-14 DIAGNOSIS — Z20822 Contact with and (suspected) exposure to covid-19: Secondary | ICD-10-CM | POA: Diagnosis not present

## 2020-09-20 DIAGNOSIS — F331 Major depressive disorder, recurrent, moderate: Secondary | ICD-10-CM | POA: Diagnosis not present

## 2020-09-20 DIAGNOSIS — G4701 Insomnia due to medical condition: Secondary | ICD-10-CM | POA: Diagnosis not present

## 2020-09-20 DIAGNOSIS — F0150 Vascular dementia without behavioral disturbance: Secondary | ICD-10-CM | POA: Diagnosis not present

## 2020-09-20 DIAGNOSIS — F064 Anxiety disorder due to known physiological condition: Secondary | ICD-10-CM | POA: Diagnosis not present

## 2020-09-21 DIAGNOSIS — Z20822 Contact with and (suspected) exposure to covid-19: Secondary | ICD-10-CM | POA: Diagnosis not present

## 2020-09-26 DIAGNOSIS — F0150 Vascular dementia without behavioral disturbance: Secondary | ICD-10-CM | POA: Diagnosis not present

## 2020-09-26 DIAGNOSIS — F064 Anxiety disorder due to known physiological condition: Secondary | ICD-10-CM | POA: Diagnosis not present

## 2020-09-26 DIAGNOSIS — F331 Major depressive disorder, recurrent, moderate: Secondary | ICD-10-CM | POA: Diagnosis not present

## 2020-09-26 DIAGNOSIS — G4701 Insomnia due to medical condition: Secondary | ICD-10-CM | POA: Diagnosis not present

## 2020-09-28 DIAGNOSIS — Z20822 Contact with and (suspected) exposure to covid-19: Secondary | ICD-10-CM | POA: Diagnosis not present

## 2020-10-24 DIAGNOSIS — G4701 Insomnia due to medical condition: Secondary | ICD-10-CM | POA: Diagnosis not present

## 2020-10-24 DIAGNOSIS — F0150 Vascular dementia without behavioral disturbance: Secondary | ICD-10-CM | POA: Diagnosis not present

## 2020-10-24 DIAGNOSIS — F064 Anxiety disorder due to known physiological condition: Secondary | ICD-10-CM | POA: Diagnosis not present

## 2020-10-24 DIAGNOSIS — F331 Major depressive disorder, recurrent, moderate: Secondary | ICD-10-CM | POA: Diagnosis not present

## 2020-10-24 DIAGNOSIS — F29 Unspecified psychosis not due to a substance or known physiological condition: Secondary | ICD-10-CM | POA: Diagnosis not present

## 2020-11-07 DIAGNOSIS — Z20822 Contact with and (suspected) exposure to covid-19: Secondary | ICD-10-CM | POA: Diagnosis not present

## 2020-11-08 DIAGNOSIS — F331 Major depressive disorder, recurrent, moderate: Secondary | ICD-10-CM | POA: Diagnosis not present

## 2020-11-08 DIAGNOSIS — F064 Anxiety disorder due to known physiological condition: Secondary | ICD-10-CM | POA: Diagnosis not present

## 2020-11-08 DIAGNOSIS — G4701 Insomnia due to medical condition: Secondary | ICD-10-CM | POA: Diagnosis not present

## 2020-11-08 DIAGNOSIS — F0150 Vascular dementia without behavioral disturbance: Secondary | ICD-10-CM | POA: Diagnosis not present

## 2020-11-21 DIAGNOSIS — F331 Major depressive disorder, recurrent, moderate: Secondary | ICD-10-CM | POA: Diagnosis not present

## 2020-11-21 DIAGNOSIS — F064 Anxiety disorder due to known physiological condition: Secondary | ICD-10-CM | POA: Diagnosis not present

## 2020-11-21 DIAGNOSIS — G4701 Insomnia due to medical condition: Secondary | ICD-10-CM | POA: Diagnosis not present

## 2020-11-21 DIAGNOSIS — F0150 Vascular dementia without behavioral disturbance: Secondary | ICD-10-CM | POA: Diagnosis not present

## 2020-12-12 DIAGNOSIS — F39 Unspecified mood [affective] disorder: Secondary | ICD-10-CM | POA: Diagnosis not present

## 2020-12-12 DIAGNOSIS — R296 Repeated falls: Secondary | ICD-10-CM | POA: Diagnosis not present

## 2020-12-12 DIAGNOSIS — K219 Gastro-esophageal reflux disease without esophagitis: Secondary | ICD-10-CM | POA: Diagnosis not present

## 2020-12-12 DIAGNOSIS — F0390 Unspecified dementia without behavioral disturbance: Secondary | ICD-10-CM | POA: Diagnosis not present

## 2020-12-12 DIAGNOSIS — I2510 Atherosclerotic heart disease of native coronary artery without angina pectoris: Secondary | ICD-10-CM | POA: Diagnosis not present

## 2020-12-12 DIAGNOSIS — Z66 Do not resuscitate: Secondary | ICD-10-CM | POA: Diagnosis not present

## 2020-12-12 DIAGNOSIS — R7301 Impaired fasting glucose: Secondary | ICD-10-CM | POA: Diagnosis not present

## 2020-12-12 DIAGNOSIS — I10 Essential (primary) hypertension: Secondary | ICD-10-CM | POA: Diagnosis not present

## 2020-12-12 DIAGNOSIS — R634 Abnormal weight loss: Secondary | ICD-10-CM | POA: Diagnosis not present

## 2020-12-12 DIAGNOSIS — I5040 Unspecified combined systolic (congestive) and diastolic (congestive) heart failure: Secondary | ICD-10-CM | POA: Diagnosis not present

## 2020-12-12 DIAGNOSIS — E785 Hyperlipidemia, unspecified: Secondary | ICD-10-CM | POA: Diagnosis not present

## 2020-12-19 DIAGNOSIS — F331 Major depressive disorder, recurrent, moderate: Secondary | ICD-10-CM | POA: Diagnosis not present

## 2020-12-19 DIAGNOSIS — G4701 Insomnia due to medical condition: Secondary | ICD-10-CM | POA: Diagnosis not present

## 2020-12-19 DIAGNOSIS — F064 Anxiety disorder due to known physiological condition: Secondary | ICD-10-CM | POA: Diagnosis not present

## 2020-12-19 DIAGNOSIS — F0150 Vascular dementia without behavioral disturbance: Secondary | ICD-10-CM | POA: Diagnosis not present

## 2021-01-16 DIAGNOSIS — F064 Anxiety disorder due to known physiological condition: Secondary | ICD-10-CM | POA: Diagnosis not present

## 2021-01-16 DIAGNOSIS — F331 Major depressive disorder, recurrent, moderate: Secondary | ICD-10-CM | POA: Diagnosis not present

## 2021-01-16 DIAGNOSIS — F0150 Vascular dementia without behavioral disturbance: Secondary | ICD-10-CM | POA: Diagnosis not present

## 2021-01-16 DIAGNOSIS — G4701 Insomnia due to medical condition: Secondary | ICD-10-CM | POA: Diagnosis not present

## 2021-02-13 DIAGNOSIS — F064 Anxiety disorder due to known physiological condition: Secondary | ICD-10-CM | POA: Diagnosis not present

## 2021-02-13 DIAGNOSIS — G4701 Insomnia due to medical condition: Secondary | ICD-10-CM | POA: Diagnosis not present

## 2021-02-13 DIAGNOSIS — F29 Unspecified psychosis not due to a substance or known physiological condition: Secondary | ICD-10-CM | POA: Diagnosis not present

## 2021-02-13 DIAGNOSIS — F0150 Vascular dementia without behavioral disturbance: Secondary | ICD-10-CM | POA: Diagnosis not present

## 2021-02-13 DIAGNOSIS — F331 Major depressive disorder, recurrent, moderate: Secondary | ICD-10-CM | POA: Diagnosis not present

## 2021-03-13 DIAGNOSIS — F064 Anxiety disorder due to known physiological condition: Secondary | ICD-10-CM | POA: Diagnosis not present

## 2021-03-13 DIAGNOSIS — G4701 Insomnia due to medical condition: Secondary | ICD-10-CM | POA: Diagnosis not present

## 2021-03-13 DIAGNOSIS — F331 Major depressive disorder, recurrent, moderate: Secondary | ICD-10-CM | POA: Diagnosis not present

## 2021-03-13 DIAGNOSIS — F0150 Vascular dementia without behavioral disturbance: Secondary | ICD-10-CM | POA: Diagnosis not present

## 2021-04-10 DIAGNOSIS — G4701 Insomnia due to medical condition: Secondary | ICD-10-CM | POA: Diagnosis not present

## 2021-04-10 DIAGNOSIS — F331 Major depressive disorder, recurrent, moderate: Secondary | ICD-10-CM | POA: Diagnosis not present

## 2021-04-10 DIAGNOSIS — F064 Anxiety disorder due to known physiological condition: Secondary | ICD-10-CM | POA: Diagnosis not present

## 2021-04-10 DIAGNOSIS — F0150 Vascular dementia without behavioral disturbance: Secondary | ICD-10-CM | POA: Diagnosis not present

## 2021-04-24 DIAGNOSIS — F39 Unspecified mood [affective] disorder: Secondary | ICD-10-CM | POA: Diagnosis not present

## 2021-04-24 DIAGNOSIS — I10 Essential (primary) hypertension: Secondary | ICD-10-CM | POA: Diagnosis not present

## 2021-04-24 DIAGNOSIS — F0390 Unspecified dementia without behavioral disturbance: Secondary | ICD-10-CM | POA: Diagnosis not present

## 2021-04-24 DIAGNOSIS — I5040 Unspecified combined systolic (congestive) and diastolic (congestive) heart failure: Secondary | ICD-10-CM | POA: Diagnosis not present

## 2021-04-24 DIAGNOSIS — R296 Repeated falls: Secondary | ICD-10-CM | POA: Diagnosis not present

## 2021-04-24 DIAGNOSIS — E039 Hypothyroidism, unspecified: Secondary | ICD-10-CM | POA: Diagnosis not present

## 2021-04-24 DIAGNOSIS — E785 Hyperlipidemia, unspecified: Secondary | ICD-10-CM | POA: Diagnosis not present

## 2021-04-24 DIAGNOSIS — Z66 Do not resuscitate: Secondary | ICD-10-CM | POA: Diagnosis not present

## 2021-04-24 DIAGNOSIS — R634 Abnormal weight loss: Secondary | ICD-10-CM | POA: Diagnosis not present

## 2021-04-24 DIAGNOSIS — R7301 Impaired fasting glucose: Secondary | ICD-10-CM | POA: Diagnosis not present

## 2021-04-24 DIAGNOSIS — I2510 Atherosclerotic heart disease of native coronary artery without angina pectoris: Secondary | ICD-10-CM | POA: Diagnosis not present

## 2021-05-08 DIAGNOSIS — F331 Major depressive disorder, recurrent, moderate: Secondary | ICD-10-CM | POA: Diagnosis not present

## 2021-05-08 DIAGNOSIS — F064 Anxiety disorder due to known physiological condition: Secondary | ICD-10-CM | POA: Diagnosis not present

## 2021-05-08 DIAGNOSIS — F0150 Vascular dementia without behavioral disturbance: Secondary | ICD-10-CM | POA: Diagnosis not present

## 2021-05-08 DIAGNOSIS — G4701 Insomnia due to medical condition: Secondary | ICD-10-CM | POA: Diagnosis not present

## 2021-06-05 DIAGNOSIS — F331 Major depressive disorder, recurrent, moderate: Secondary | ICD-10-CM | POA: Diagnosis not present

## 2021-06-05 DIAGNOSIS — G4701 Insomnia due to medical condition: Secondary | ICD-10-CM | POA: Diagnosis not present

## 2021-06-05 DIAGNOSIS — F0150 Vascular dementia without behavioral disturbance: Secondary | ICD-10-CM | POA: Diagnosis not present

## 2021-06-05 DIAGNOSIS — F064 Anxiety disorder due to known physiological condition: Secondary | ICD-10-CM | POA: Diagnosis not present

## 2021-06-12 DIAGNOSIS — Z23 Encounter for immunization: Secondary | ICD-10-CM | POA: Diagnosis not present

## 2021-07-03 DIAGNOSIS — F0150 Vascular dementia without behavioral disturbance: Secondary | ICD-10-CM | POA: Diagnosis not present

## 2021-07-03 DIAGNOSIS — G4701 Insomnia due to medical condition: Secondary | ICD-10-CM | POA: Diagnosis not present

## 2021-07-03 DIAGNOSIS — F064 Anxiety disorder due to known physiological condition: Secondary | ICD-10-CM | POA: Diagnosis not present

## 2021-07-03 DIAGNOSIS — F331 Major depressive disorder, recurrent, moderate: Secondary | ICD-10-CM | POA: Diagnosis not present

## 2021-07-31 DIAGNOSIS — F331 Major depressive disorder, recurrent, moderate: Secondary | ICD-10-CM | POA: Diagnosis not present

## 2021-07-31 DIAGNOSIS — G4701 Insomnia due to medical condition: Secondary | ICD-10-CM | POA: Diagnosis not present

## 2021-07-31 DIAGNOSIS — F0150 Vascular dementia without behavioral disturbance: Secondary | ICD-10-CM | POA: Diagnosis not present

## 2021-07-31 DIAGNOSIS — F064 Anxiety disorder due to known physiological condition: Secondary | ICD-10-CM | POA: Diagnosis not present

## 2021-08-28 DIAGNOSIS — F331 Major depressive disorder, recurrent, moderate: Secondary | ICD-10-CM | POA: Diagnosis not present

## 2021-08-28 DIAGNOSIS — F0150 Vascular dementia without behavioral disturbance: Secondary | ICD-10-CM | POA: Diagnosis not present

## 2021-08-28 DIAGNOSIS — F064 Anxiety disorder due to known physiological condition: Secondary | ICD-10-CM | POA: Diagnosis not present

## 2021-08-28 DIAGNOSIS — G4701 Insomnia due to medical condition: Secondary | ICD-10-CM | POA: Diagnosis not present

## 2022-11-07 ENCOUNTER — Emergency Department (HOSPITAL_BASED_OUTPATIENT_CLINIC_OR_DEPARTMENT_OTHER)

## 2022-11-07 ENCOUNTER — Emergency Department (HOSPITAL_BASED_OUTPATIENT_CLINIC_OR_DEPARTMENT_OTHER)
Admission: EM | Admit: 2022-11-07 | Discharge: 2022-11-07 | Disposition: A | Discharge status: Skilled Nursing Facility | Attending: Emergency Medicine | Admitting: Emergency Medicine

## 2022-11-07 ENCOUNTER — Encounter (HOSPITAL_BASED_OUTPATIENT_CLINIC_OR_DEPARTMENT_OTHER): Payer: Self-pay

## 2022-11-07 DIAGNOSIS — E039 Hypothyroidism, unspecified: Secondary | ICD-10-CM | POA: Diagnosis not present

## 2022-11-07 DIAGNOSIS — M25532 Pain in left wrist: Secondary | ICD-10-CM | POA: Diagnosis not present

## 2022-11-07 DIAGNOSIS — W010XXA Fall on same level from slipping, tripping and stumbling without subsequent striking against object, initial encounter: Secondary | ICD-10-CM | POA: Insufficient documentation

## 2022-11-07 DIAGNOSIS — I110 Hypertensive heart disease with heart failure: Secondary | ICD-10-CM | POA: Diagnosis not present

## 2022-11-07 DIAGNOSIS — Z7982 Long term (current) use of aspirin: Secondary | ICD-10-CM | POA: Insufficient documentation

## 2022-11-07 DIAGNOSIS — S62102A Fracture of unspecified carpal bone, left wrist, initial encounter for closed fracture: Secondary | ICD-10-CM

## 2022-11-07 DIAGNOSIS — Z79899 Other long term (current) drug therapy: Secondary | ICD-10-CM | POA: Insufficient documentation

## 2022-11-07 DIAGNOSIS — R519 Headache, unspecified: Secondary | ICD-10-CM | POA: Diagnosis not present

## 2022-11-07 DIAGNOSIS — R109 Unspecified abdominal pain: Secondary | ICD-10-CM | POA: Insufficient documentation

## 2022-11-07 DIAGNOSIS — I509 Heart failure, unspecified: Secondary | ICD-10-CM | POA: Diagnosis not present

## 2022-11-07 DIAGNOSIS — S6292XA Unspecified fracture of left wrist and hand, initial encounter for closed fracture: Secondary | ICD-10-CM | POA: Insufficient documentation

## 2022-11-07 DIAGNOSIS — N838 Other noninflammatory disorders of ovary, fallopian tube and broad ligament: Secondary | ICD-10-CM

## 2022-11-07 DIAGNOSIS — N839 Noninflammatory disorder of ovary, fallopian tube and broad ligament, unspecified: Secondary | ICD-10-CM | POA: Insufficient documentation

## 2022-11-07 DIAGNOSIS — R0789 Other chest pain: Secondary | ICD-10-CM | POA: Insufficient documentation

## 2022-11-07 DIAGNOSIS — S6992XA Unspecified injury of left wrist, hand and finger(s), initial encounter: Secondary | ICD-10-CM | POA: Diagnosis present

## 2022-11-07 DIAGNOSIS — W19XXXA Unspecified fall, initial encounter: Secondary | ICD-10-CM

## 2022-11-07 LAB — CBC WITH DIFFERENTIAL/PLATELET
Abs Immature Granulocytes: 0.02 10*3/uL (ref 0.00–0.07)
Basophils Absolute: 0 10*3/uL (ref 0.0–0.1)
Basophils Relative: 1 %
Eosinophils Absolute: 0.1 10*3/uL (ref 0.0–0.5)
Eosinophils Relative: 2 %
HCT: 41 % (ref 36.0–46.0)
Hemoglobin: 13.6 g/dL (ref 12.0–15.0)
Immature Granulocytes: 0 %
Lymphocytes Relative: 23 %
Lymphs Abs: 1.5 10*3/uL (ref 0.7–4.0)
MCH: 32.6 pg (ref 26.0–34.0)
MCHC: 33.2 g/dL (ref 30.0–36.0)
MCV: 98.3 fL (ref 80.0–100.0)
Monocytes Absolute: 0.6 10*3/uL (ref 0.1–1.0)
Monocytes Relative: 8 %
Neutro Abs: 4.4 10*3/uL (ref 1.7–7.7)
Neutrophils Relative %: 66 %
Platelets: 198 10*3/uL (ref 150–400)
RBC: 4.17 MIL/uL (ref 3.87–5.11)
RDW: 13.1 % (ref 11.5–15.5)
WBC: 6.7 10*3/uL (ref 4.0–10.5)
nRBC: 0 % (ref 0.0–0.2)

## 2022-11-07 LAB — COMPREHENSIVE METABOLIC PANEL
ALT: 11 U/L (ref 0–44)
AST: 22 U/L (ref 15–41)
Albumin: 3.7 g/dL (ref 3.5–5.0)
Alkaline Phosphatase: 77 U/L (ref 38–126)
Anion gap: 8 (ref 5–15)
BUN: 10 mg/dL (ref 8–23)
CO2: 27 mmol/L (ref 22–32)
Calcium: 8.9 mg/dL (ref 8.9–10.3)
Chloride: 99 mmol/L (ref 98–111)
Creatinine, Ser: 0.79 mg/dL (ref 0.44–1.00)
GFR, Estimated: >60 mL/min (ref >60)
Glucose, Bld: 100 mg/dL — ABNORMAL HIGH (ref 70–99)
Potassium: 3.9 mmol/L (ref 3.5–5.1)
Sodium: 134 mmol/L — ABNORMAL LOW (ref 135–145)
Total Bilirubin: 0.6 mg/dL (ref 0.3–1.2)
Total Protein: 7 g/dL (ref 6.5–8.1)

## 2022-11-07 MED ORDER — ACETAMINOPHEN 325 MG PO TABS
650.0000 mg | ORAL_TABLET | Freq: Once | ORAL | Status: AC
  Administered 2022-11-07: 650 mg via ORAL
  Filled 2022-11-07: qty 2

## 2022-11-07 MED ORDER — IOHEXOL 300 MG/ML  SOLN
100.0000 mL | Freq: Once | INTRAMUSCULAR | Status: AC | PRN
  Administered 2022-11-07: 80 mL via INTRAVENOUS

## 2022-11-07 NOTE — ED Notes (Signed)
Spoke with hosp[facility] of the [facility] nurse requesting update on patient status/discharge

## 2022-11-07 NOTE — ED Provider Notes (Signed)
[facility] Provider Note   CSN: [id number] Arrival date & time: 11/07/22  1010     History  Chief Complaint  Patient presents with   Leah Trujillo is a 89 y.o. female.   Fall   89 year old female presents emergency department after fall.  Patient at [facility].  Accompanied by daughter who is primary historian.  Daughter states that patient was walking out of the room unassisted and had mechanical fall when she tripped landing on her right side.  Notes trauma to head but no loss of consciousness.  Patient on aspirin but no anticoagulation.  Currently complaining of headache, left wrist pain as well as left chest/abdomen pain.  Patient usually ambulates with walker or with personal assistance but sometimes "wanders off" per daughter.  Patient currently declining shortness of breath, nausea, vomiting, visual disturbance, baseline, weakness/sensory deficits in upper or lower extremities, slurred speech, facial droop.  Past medical history significant for CHF, GERD, hypercholesterolemia, hypothyroidism, hypertension, ischemic cardiomyopathy  Home Medications Prior to Admission medications   Medication Sig Start Date End Date Taking? Authorizing Provider  acetaminophen (TYLENOL) 325 MG tablet Take 650 mg by mouth every 6 (six) hours as needed.    [provider]  aspirin 81 MG tablet Take 81 mg by mouth daily.    [provider]  citalopram (CELEXA) 10 MG tablet Take 10 mg by mouth daily. 03/13/16   [provider]  desonide (DESOWEN) 0.05 % cream Apply 1 application topically daily. Applies to ears 05/13/16   [provider]  diclofenac Sodium (VOLTAREN) 1 % GEL Apply 2 g topically as needed.    [provider]  divalproex (DEPAKOTE) 125 MG DR tablet Take 125 mg by mouth 2 (two) times daily. 02/29/20   [provider]  furosemide (LASIX) 20 MG tablet Take 20 mg  one tablet  daily except if any increase swelling may take an additional 20 mg daily 04/03/20   Leonie Man, MD  levothyroxine (SYNTHROID) 75 MCG tablet Take 75 mcg by mouth daily before breakfast.    [provider]  LORazepam (ATIVAN) 0.5 MG tablet Take 0.5 mg by mouth at bedtime.    [provider]  Melatonin 10 MG TABS One qhs 12/21/18   Rodriguez-Southworth, Sunday Spillers, PA-C  naproxen sodium (ANAPROX) 220 MG tablet Take 220 mg by mouth 2 (two) times daily as needed.     [provider]  nitroGLYCERIN (NITROSTAT) 0.4 MG SL tablet Place 1 tablet (0.4 mg total) under the tongue every 5 (five) minutes as needed for chest pain. 07/10/16   Theora Gianotti, NP  omeprazole (PRILOSEC) 20 MG capsule Take by mouth 2 (two) times daily before a meal.     [provider]  Polyethyl Glycol-Propyl Glycol (SYSTANE OP) Place 1 drop into both eyes every 4 (four) hours as needed (dry eyes).    [provider]  pravastatin (PRAVACHOL) 80 MG tablet Take 80 mg by mouth daily. 06/14/16   [provider]  prednisoLONE acetate (PRED FORTE) 1 % ophthalmic suspension Place 1 drop into both eyes daily. 03/15/16   [provider]  rosuvastatin (CRESTOR) 20 MG tablet Take by mouth. 09/08/17   [provider]      Allergies    Codeine    Review of Systems   Review of Systems  All other systems reviewed and are negative.   Physical Exam Updated Vital Signs BP Marland Kitchen)  147/66 (BP Location: Right Arm)   Pulse 75   Temp 98.1 F (36.7 C) (Axillary)   Resp 17   Ht '4\' 11"'$  (1.499 m)   Wt 46.8 kg   SpO2 96%   BMI 20.84 kg/m  Physical Exam Vitals and nursing note reviewed.  Constitutional:      General: She is not in acute distress.    Appearance: She is well-developed.  HENT:     Head: Normocephalic.     Comments: Patient with skin abrasion midline of forehead as well as on the tip of her nose.  No active bleeding appreciated. Eyes:      Conjunctiva/sclera: Conjunctivae normal.  Cardiovascular:     Rate and Rhythm: Normal rate and regular rhythm.  Pulmonary:     Effort: Pulmonary effort is normal. No respiratory distress.     Breath sounds: Normal breath sounds. No wheezing, rhonchi or rales.     Comments: Left lower tenderness.  Mild overlying ecchymosis appreciated. Abdominal:     Palpations: Abdomen is soft.     Tenderness: There is no abdominal tenderness.  Musculoskeletal:        General: No swelling.     Cervical back: Neck supple.     Comments: No midline tenderness of cervical, thoracic, lumbar spine.  Mild tender palpation of left wrist as well as proximal left hand but otherwise, upper extremities without tenderness to palpation.  Patient with near full range of motion of bilateral shoulders, full range of motion of bilateral elbows, wrists, digits.  Radial pulses 2+ bilaterally.  No sensory deficits along major nerve distributions of upper extremities.  Patient denies tenderness to palpation of bilateral lower extremities.  Patient able to move lower extremities hip, knees, ankles, digits without difficulty.  Pedal pulses 2+ bilaterally.  No sensory deficits along major nerve distributions lower extremities.  No overlying skin abnormalities appreciated of extremities besides mild posterior left wrist swelling.  No obvious breaks in skin.  Patient able to make fist, thumbs up, resist horizontal adduction of digits, okay sign, extend wrist of left hand.  Skin:    General: Skin is warm and dry.     Capillary Refill: Capillary refill takes less than 2 seconds.  Neurological:     Mental Status: She is alert.     Comments: Alert and oriented to self, place, and event.   Speech is fluent, clear without dysarthria or dysphasia.   Strength 5/5 in upper/lower extremities   Sensation symmetric bilateral upper and lower extremities   CN I not tested  CN II not tested CN III, IV, VI PERRLA and EOMs intact bilaterally   CN V Intact sensation to sharp and light touch to the face  CN VII facial movements symmetric  CN VIII not tested  CN IX, X no uvula deviation, symmetric rise of soft palate  CN XI 5/5 SCM and trapezius strength bilaterally  CN XII Midline tongue protrusion, symmetric L/R movements     Psychiatric:        Mood and Affect: Mood normal.     ED Results / Procedures / Treatments   Labs (all labs ordered are listed, but only abnormal results are displayed) Labs Reviewed  COMPREHENSIVE METABOLIC PANEL - Abnormal; Notable for the following components:      Result Value   Sodium 134 (*)    Glucose, Bld 100 (*)    All other components within normal limits  CBC WITH DIFFERENTIAL/PLATELET    EKG None  Radiology CT CHEST  ABDOMEN PELVIS W CONTRAST  Result Date: 11/07/2022 CLINICAL DATA:  Pain after fall. EXAM: CT CHEST, ABDOMEN, AND PELVIS WITH CONTRAST TECHNIQUE: Multidetector CT imaging of the chest, abdomen and pelvis was performed following the standard protocol during bolus administration of intravenous contrast. RADIATION DOSE REDUCTION: This exam was performed according to the departmental dose-optimization program which includes automated exposure control, adjustment of the mA and/or kV according to patient size and/or use of iterative reconstruction technique. CONTRAST:  65m OMNIPAQUE IOHEXOL 300 MG/ML  SOLN COMPARISON:  Chest x-ray 08/09/2019 FINDINGS: CT CHEST FINDINGS Cardiovascular: Heart is enlarged. Coronary artery calcifications are seen. No significant pericardial effusion. Coronary artery calcifications are seen. The thoracic aorta has some scattered atherosclerotic plaque. There is pulsation artifact alone no mediastinal hematoma. There are calcifications along the aortic valve. There is tortuosity seen of the course of the left brachiocephalic vein, nonspecific. Mediastinum/Nodes: No specific abnormal lymph node enlargement identified in the axillary region, hilum or  mediastinum. Mildly patulous thoracic esophagus. Small thyroid gland. Lungs/Pleura: Breathing motion seen throughout the lungs. There is diffuse interstitial septal thickening with areas of scarring and fibrosis. Areas of some air trapping. No pneumothorax or effusion. Calcified nodule left lung on image 433of series 4 consistent with old granulomatous disease. Few tiny areas of nodularity are identified such as peripheral right upper lobe measuring 3 mm on series 4, image 41, subpleural. Other similar areas. Musculoskeletal: Osteopenia. Curvature of the spine with degenerative changes. There is some compression of the superior aspect of T11 with some sclerosis. This could be chronic but is age indeterminate. Please correlate for any known history and location of any specific pain. CT ABDOMEN PELVIS FINDINGS Hepatobiliary: Fatty liver infiltration identified. There is an area of enhancement seen in the right hepatic lobe on series 2 image 44 which appears to connect branches of the portal vein and hepatic vein, nonspecific shunt. The portal vein is patent. Gallbladder is nondilated. Pancreas: Unremarkable. No pancreatic ductal dilatation or surrounding inflammatory changes. Spleen: Normal in size without focal abnormality. Adrenals/Urinary Tract: The adrenal glands are preserved. No enhancing renal mass. There is some ectasia of the renal collecting systems bilaterally with an extrarenal pelvis on the right side. The ectasia extends down to the bladder which is distended with fluid. Stomach/Bowel: Moderate colonic stool. Few scattered colonic diverticula along the left side. Small bowel is nondilated. There are areas of wall thickening along the stomach and some heterogeneous enhancement. Please correlate for underlying lesion. Vascular/Lymphatic: Diffuse vascular calcifications identified. Normal caliber aorta and IVC. Specific abnormal lymph node enlargement identified in the abdomen or pelvis Reproductive: Uterus  is present. However there are soft tissue masses in the adnexa bilaterally with a cystic component. The solid area on the left measures a proximally 5.8 by 3.5 cm. The solid component on the right side measures a proximally 5.4 by 4.0 cm. There is a cystic area in the middle which on series 2, image 69 would measure 7.5 by 5.7 cm. Based on the overall appearance is worrisome for an aggressive malignant lesion of the ovaries. There is a differential. Recommend further evaluation if there is no prior when clinically appropriate. Other: Anasarca.  Diffuse motion. Musculoskeletal: There are fractures of both pubic bones. Based on appearance these may be more subacute acute but an acute component is not excluded. There is also fracture of the right iliac bone and along the left hemi sacrum. Similar differential. Curvature of the lumbar spine with degenerative changes. Trace anterolisthesis of L5 on S1.  IMPRESSION: No pneumothorax or pleural effusion. There are extensive areas of interstitial septal thickening lungs with scarring and fibrosis. Enlarged heart. No bowel obstruction, free air or free fluid. No evidence of solid organ injury. Bilateral adnexal soft tissue masses with a central cystic area worrisome for a true aggressive neoplasm such as ovarian. No ascites. Recommend further evaluation. Fatty liver infiltration. Wall thickening noted of the gastric wall with some nodularity and enhancement. Dedicated workup when appropriate and correlate with any particular symptoms. Extrarenal pelvis of the right kidney. Slight ectasia of the renal collecting systems. The bladder is distended. Simple follow-up renal ultrasound can be performed after emptying of the urinary bladder to see if this persists. Multifocal areas of bony injury including along the pelvis and some compression deformity of the T11 vertebral body. Based on appearance these could be more subacute to chronic but has some areas that are age-indeterminate.  Please correlate for any known history and prior imaging. Motion artifacts Electronically Signed   By: Jill Side M.D.   On: 11/07/2022 15:04   CT Maxillofacial WO CM  Result Date: 11/07/2022 CLINICAL DATA:  Trauma EXAM: CT MAXILLOFACIAL WITHOUT CONTRAST TECHNIQUE: Multidetector CT imaging of the maxillofacial structures was performed. Multiplanar CT image reconstructions were also generated. RADIATION DOSE REDUCTION: This exam was performed according to the departmental dose-optimization program which includes automated exposure control, adjustment of the mA and/or kV according to patient size and/or use of iterative reconstruction technique. COMPARISON:  None Available. FINDINGS: Osseous: No fracture or mandibular dislocation. No destructive process. Orbits: Negative. No traumatic or inflammatory finding. Sinuses: Clear. Soft tissues: Negative. IMPRESSION: Unremarkable examination of the facial bones. Electronically Signed   By: Sammie Bench M.D.   On: 11/07/2022 14:45   CT Cervical Spine Wo Contrast  Result Date: 11/07/2022 CLINICAL DATA:  Trauma EXAM: CT CERVICAL SPINE WITHOUT CONTRAST TECHNIQUE: Multidetector CT imaging of the cervical spine was performed without intravenous contrast. Multiplanar CT image reconstructions were also generated. RADIATION DOSE REDUCTION: This exam was performed according to the departmental dose-optimization program which includes automated exposure control, adjustment of the mA and/or kV according to patient size and/or use of iterative reconstruction technique. COMPARISON:  None Available. FINDINGS: Alignment: Normal. Skull base and vertebrae: No acute fracture. No primary bone lesion or focal pathologic process. Soft tissues and spinal canal: No prevertebral fluid or swelling. No visible canal hematoma. Disc levels: Degenerative disc disease identified with disc space narrowing and marginal osteophyte formation most significantly from the C4-5 through C6-7 levels.  IMPRESSION: Degenerative changes.  No traumatic abnormalities identified. Electronically Signed   By: Sammie Bench M.D.   On: 11/07/2022 14:42   CT Head Wo Contrast  Result Date: 11/07/2022 CLINICAL DATA:  Trauma EXAM: CT HEAD WITHOUT CONTRAST TECHNIQUE: Contiguous axial images were obtained from the base of the skull through the vertex without intravenous contrast. RADIATION DOSE REDUCTION: This exam was performed according to the departmental dose-optimization program which includes automated exposure control, adjustment of the mA and/or kV according to patient size and/or use of iterative reconstruction technique. COMPARISON:  None Available. FINDINGS: Brain: There is periventricular white matter decreased attenuation consistent with small vessel ischemic changes. Ventricles, sulci and cisterns are prominent consistent with age related involutional changes. No acute intracranial hemorrhage, mass effect or shift. No hydrocephalus. Vascular: No hyperdense vessel or unexpected calcification. Skull: Normal. Negative for fracture or focal lesion. Sinuses/Orbits: No acute finding. IMPRESSION: Atrophy and chronic small vessel ischemic changes. No acute intracranial process identified. Electronically Signed  By: Sammie Bench M.D.   On: 11/07/2022 14:40   DG Wrist Complete Left  Result Date: 11/07/2022 CLINICAL DATA:  Fall, left hand and wrist pain EXAM: LEFT HAND - COMPLETE 3+ VIEW; LEFT WRIST - COMPLETE 3+ VIEW COMPARISON:  None Available. FINDINGS: Acute fracture of the distal radius with minimal impaction. Intra-articular extension to the radiocarpal joint through the base of the radial styloid. Minimally displaced fracture through the base of the ulnar styloid. No dislocation. Intact carpal bones. Moderate to severe degenerative changes. Extensive chondrocalcinosis. Soft tissue swelling at the fracture sites. IMPRESSION: 1. Acute minimally impacted intra-articular fracture of the distal radius. 2.  Minimally displaced fracture through the base of the ulnar styloid. 3. Advanced degenerative changes of the hand and wrist. Electronically Signed   By: Davina Poke D.O.   On: 11/07/2022 11:20   DG Hand Complete Left  Result Date: 11/07/2022 CLINICAL DATA:  Fall, left hand and wrist pain EXAM: LEFT HAND - COMPLETE 3+ VIEW; LEFT WRIST - COMPLETE 3+ VIEW COMPARISON:  None Available. FINDINGS: Acute fracture of the distal radius with minimal impaction. Intra-articular extension to the radiocarpal joint through the base of the radial styloid. Minimally displaced fracture through the base of the ulnar styloid. No dislocation. Intact carpal bones. Moderate to severe degenerative changes. Extensive chondrocalcinosis. Soft tissue swelling at the fracture sites. IMPRESSION: 1. Acute minimally impacted intra-articular fracture of the distal radius. 2. Minimally displaced fracture through the base of the ulnar styloid. 3. Advanced degenerative changes of the hand and wrist. Electronically Signed   By: Davina Poke D.O.   On: 11/07/2022 11:20    Procedures Reduction of fracture  Date/Time: 11/07/2022 6:29 PM  Performed by: Wilnette Kales, PA Authorized by: Wilnette Kales, PA  Consent: Verbal consent obtained. Risks and benefits: risks, benefits and alternatives were discussed Consent given by: patient Patient understanding: patient states understanding of the procedure being performed Patient consent: the patient's understanding of the procedure matches consent given Procedure consent: procedure consent matches procedure scheduled Relevant documents: relevant documents present and verified Imaging studies: imaging studies available Patient identity confirmed: verbally with patient Local anesthesia used: no  Anesthesia: Local anesthesia used: no  Sedation: Patient sedated: no  Patient tolerance: patient tolerated the procedure well with no immediate complications Comments: Left wrist  reduction   .Splint Application  Date/Time: 11/07/2022 6:30 PM  Performed by: Wilnette Kales, PA Authorized by: Wilnette Kales, PA   Consent:    Consent obtained:  Verbal   Consent given by:  Patient   Risks, benefits, and alternatives were discussed: yes     Risks discussed:  Discoloration, numbness, pain and swelling   Alternatives discussed:  Delayed treatment, no treatment, observation, alternative treatment and referral Universal protocol:    Procedure explained and questions answered to patient or proxy's satisfaction: yes     Imaging studies available: yes     Immediately prior to procedure a time out was called: yes     Patient identity confirmed:  Verbally with patient Pre-procedure details:    Distal neurologic exam:  Normal   Distal perfusion: distal pulses strong   Procedure details:    Location:  Wrist   Wrist location:  L wrist   Strapping: no     Splint type:  Sugar tong   Supplies:  Fiberglass and sling   Attestation: Splint applied and adjusted personally by me   Post-procedure details:    Distal neurologic exam:  Normal   Procedure  completion:  Tolerated well, no immediate complications     Medications Ordered in ED Medications  acetaminophen (TYLENOL) tablet 650 mg (650 mg Oral Given 11/07/22 1047)  iohexol (OMNIPAQUE) 300 MG/ML solution 100 mL (80 mLs Intravenous Contrast Given 11/07/22 1409)    ED Course/ Medical Decision Making/ A&P Clinical Course as of 11/07/22 1831  Thu Nov 07, 2022  1534 CT CHEST ABDOMEN PELVIS W CONTRAST [CR]    Clinical Course User Index [CR] Wilnette Kales, PA                             Medical Decision Making Amount and/or Complexity of Data Reviewed Labs: ordered. Radiology: ordered. Decision-making details documented in ED Course.  Risk OTC drugs. Prescription drug management.   This patient presents to the ED for concern of fall, this involves an extensive number of treatment options, and is a  complaint that carries with it a high risk of complications and morbidity.  The differential diagnosis includes CVA, fracture, strain chest pain, dislocation, pneumothorax, solid organ damage   Co morbidities that complicate the patient evaluation  See HPI   Additional history obtained:  Additional history obtained from EMR External records from outside source obtained and reviewed including hospital records   Lab Tests:  I Ordered, and personally interpreted labs.  The pertinent results include: No leukocytosis.  No evidence of anemia.  Platelets within normal range.  Mild hyponatremia with a sodium of 134.  Otherwise, electrolytes within normal range.  No transaminitis.  No renal dysfunction.   Imaging Studies ordered:  I ordered imaging studies including CT head/C-spine/maxillofacial, CT chest abdomen pelvis, x-ray left wrist and hand I independently visualized and interpreted imaging which showed  CT head/C-spine/maxillofacial: Atrophy and chronic small vessel ischemic changes.  No acute intracranial process.  Degenerative changes.  No traumatic abnormalities identified.  Unremarkable examination of facial bones. CT chest abdomen pelvis: No pneumothorax or pleural effusion.  Extensive areas of interstitial septal thickening of the lungs with scarring and fibrosis.  Enlarged heart.  No bowel obstruction.  Bilateral Soft tissue masses with central cystic area concerning for aggressive neoplasm.  Fatty liver infiltration.  Wall thickening of stomach with nodularity enhancement.  Extrarenal pelvis of right kidney slight ectasia of renal collecting system.  Distended bladder.  Multifocal areas of bony injury of pelvis and compression of T11 vertebral body; age-indeterminate. X-ray of left wrist and hand: Acute minimally displaced intra-articular fracture of distal radius as well as minimally displaced fracture through base of ulnar styloid.  Advanced degenerative changes of hand and  wrist. I agree with the radiologist interpretation  Cardiac Monitoring: / EKG:  The patient was maintained on a cardiac monitor.  I personally viewed and interpreted the cardiac monitored which showed an underlying rhythm of: Sinus rhythm   Consultations Obtained:  I requested consultation with attending physician Dr. Kathrynn Humble who independently evaluated the patient and was in agreement with treatment plan going forward.   Problem List / ED Course / Critical interventions / Medication management  Fall, left wrist fracture, bilateral ovarian mass, I ordered medication including Tylenol   Reevaluation of the patient after these medicines showed that the patient improved I have reviewed the patients home medicines and have made adjustments as needed   Social Determinants of Health:  Denies tobacco, illicit drug use   Test / Admission - Considered:  Fall, left wrist fracture, bilateral ovarian mass, Vitals signs significant for mild hypertension.  Otherwise within normal range and stable throughout visit. Laboratory/imaging studies significant for: See above Patient with baseline memory deficits in memory care unit at assisted living facility presenting with mechanical fall.  Patient with evidence of left wrist fracture reduced and splinted in manner as depicted above.  Patient recommended follow-up with orthopedics regarding left wrist fracture.  Otherwise, CT imaging of head, neck, chest abdomen pelvis negative for any acute traumatic finding.  Incidental findings of CT chest abdomen pelvis include as listed above, concerns for adnexal mass of malignancy as well as changes of the gastric wall.  Patient recommended follow-up with oncology regarding adnexal masses as well as GI regarding findings of gastric wall.  Patient overall well-appearing in no acute distress.  Treatment plan discussed at length with patient and daughter and they acknowledge understanding were agreeable to said  plan. Worrisome signs and symptoms were discussed with the patient/daughter, and they acknowledged understanding to return to the ED if noticed. Patient was stable upon discharge.          Final Clinical Impression(s) / ED Diagnoses Final diagnoses:  Fall, initial encounter  Closed fracture of left wrist, initial encounter  Ovarian mass    Rx / DC Orders ED Discharge Orders          Ordered    Ambulatory referral to Gynecologic Oncology        11/07/22 [facility], [place], [place] 11/07/22 [place], MD 11/08/22 [phone]

## 2022-11-07 NOTE — ED Notes (Signed)
Reports rec'd from prev RN

## 2022-11-07 NOTE — Discharge Instructions (Addendum)
Note the workup today was consistent with a left wrist fracture.  Keep splint in place until follow-up with orthopedics.  Call to earliest convenience to set up an appointment.  In the meantime, recommend rest, ice, elevation of affected extremity as well as taking Tylenol for pain. As discussed, there is wall thickening with nodularity of stomach.  Recommend follow-up with gastroenterology.  She also has ovarian masses concerning for malignancy.  Recommend follow-up with oncology.  Please do not hesitate to return to emergency department the worrisome signs and symptoms we discussed become apparent.

## 2022-11-07 NOTE — ED Triage Notes (Signed)
BIB EMS from [facility] care unit. Was using walker ambulating in room and had mechanical fall, tripping over shoe. Abrasions to forehead and nose. Arrives in [place] for precaution by EMS. C/o left hand pain. Denies blood thinners, acting baseline per facility.   Came with DNR form

## 2022-11-08 ENCOUNTER — Telehealth: Payer: Self-pay | Admitting: *Deleted

## 2022-11-08 NOTE — Telephone Encounter (Signed)
Spoke with the patient's daughter to schedule a new patient appt for possible Monday if they can come and she stated "I'm trying to get her in at [facility]. I will call you back."

## 2024-01-01 DEATH — deceased
